# Patient Record
Sex: Male | Born: 1950 | Race: White | Hispanic: No | Marital: Married | Smoking: Current every day smoker
Health system: Southern US, Community
[De-identification: ages and names within clinical notes are randomized; demographics above are authoritative.]

## PROBLEM LIST (undated history)

## (undated) DIAGNOSIS — I739 Peripheral vascular disease, unspecified: Secondary | ICD-10-CM

## (undated) DIAGNOSIS — E785 Hyperlipidemia, unspecified: Secondary | ICD-10-CM

## (undated) DIAGNOSIS — D72829 Elevated white blood cell count, unspecified: Secondary | ICD-10-CM

## (undated) DIAGNOSIS — J449 Chronic obstructive pulmonary disease, unspecified: Secondary | ICD-10-CM

## (undated) DIAGNOSIS — Z8739 Personal history of other diseases of the musculoskeletal system and connective tissue: Secondary | ICD-10-CM

## (undated) DIAGNOSIS — I10 Essential (primary) hypertension: Secondary | ICD-10-CM

## (undated) DIAGNOSIS — E119 Type 2 diabetes mellitus without complications: Secondary | ICD-10-CM

## (undated) HISTORY — PX: ELBOW ARTHROCENTESIS: SUR46

## (undated) HISTORY — DX: Peripheral vascular disease, unspecified: I73.9

## (undated) HISTORY — PX: MOUTH SURGERY: SHX715

## (undated) HISTORY — PX: NO PAST SURGERIES: SHX2092

## (undated) HISTORY — DX: Personal history of other diseases of the musculoskeletal system and connective tissue: Z87.39

## (undated) HISTORY — DX: Elevated white blood cell count, unspecified: D72.829

---

## 2015-03-19 DIAGNOSIS — E782 Mixed hyperlipidemia: Secondary | ICD-10-CM | POA: Insufficient documentation

## 2017-06-27 ENCOUNTER — Encounter: Payer: Self-pay | Admitting: Emergency Medicine

## 2017-06-27 ENCOUNTER — Ambulatory Visit (INDEPENDENT_AMBULATORY_CARE_PROVIDER_SITE_OTHER): Payer: Medicare HMO

## 2017-06-27 ENCOUNTER — Ambulatory Visit
Admission: EM | Admit: 2017-06-27 | Discharge: 2017-06-27 | Disposition: A | Discharge status: Home / Self Care | Payer: Medicare HMO | Attending: Family Medicine | Admitting: Family Medicine

## 2017-06-27 DIAGNOSIS — B9789 Other viral agents as the cause of diseases classified elsewhere: Secondary | ICD-10-CM

## 2017-06-27 DIAGNOSIS — B349 Viral infection, unspecified: Secondary | ICD-10-CM

## 2017-06-27 DIAGNOSIS — J441 Chronic obstructive pulmonary disease with (acute) exacerbation: Secondary | ICD-10-CM | POA: Diagnosis not present

## 2017-06-27 DIAGNOSIS — J069 Acute upper respiratory infection, unspecified: Secondary | ICD-10-CM

## 2017-06-27 DIAGNOSIS — R05 Cough: Secondary | ICD-10-CM | POA: Diagnosis not present

## 2017-06-27 HISTORY — DX: Essential (primary) hypertension: I10

## 2017-06-27 HISTORY — DX: Hyperlipidemia, unspecified: E78.5

## 2017-06-27 MED ORDER — ALBUTEROL SULFATE HFA 108 (90 BASE) MCG/ACT IN AERS: 2.0000 | INHALATION_SPRAY | RESPIRATORY_TRACT | 0 refills | Status: DC | PRN

## 2017-06-27 MED ORDER — PREDNISONE 20 MG PO TABS: 40.0000 mg | ORAL_TABLET | Freq: Every day | ORAL | 0 refills | Status: DC

## 2017-06-27 MED ORDER — HYDROCOD POLST-CPM POLST ER 10-8 MG/5ML PO SUER: 5.0000 mL | Freq: Every evening | ORAL | 0 refills | Status: DC | PRN

## 2017-06-27 MED ORDER — BENZONATATE 100 MG PO CAPS: 100.0000 mg | ORAL_CAPSULE | Freq: Three times a day (TID) | ORAL | 0 refills | Status: DC | PRN

## 2017-06-27 MED ORDER — DOXYCYCLINE HYCLATE 100 MG PO CAPS: 100.0000 mg | ORAL_CAPSULE | Freq: Two times a day (BID) | ORAL | 0 refills | Status: DC

## 2017-06-27 NOTE — ED Provider Notes (Addendum)
[facility] ____________________________________________  Time seen: Approximately 1:15PM  I have reviewed the triage vital signs and the nursing notes.   HISTORY  Chief Complaint URI   HPI Angel Simon is a 66 y.o. male presenting with wife at bedside for 4-5 days of runny nose, nasal congestion, cough and chest congestion.  Reports tends to cough and what he describes as fits, coughing back to back frequently.  Denies accompanying chest pain or shortness of breath.  States has occasionally noticed some wheezing, and his wife reports intermittently hearing is wheezing, primarily in the morning.  Has had a taken over-the-counter medications for the same complaints.  Denies known fevers.  Denies sore throat.  Denies known history of COPD or respiratory problems.  Reports history of hypertension, hyperlipidemia and is a chronic smoker.  Denies aggravating or alleviating factors.  States cough is intermittently keeping him up at night.  Reports continues to eat and drink well.  Reports continues to remain active.  Reports otherwise feels well.  Denies chest pain, shortness of breath, abdominal pain, hemoptysis, dysuria, extremity pain, extremity swelling or rash. Denies recent sickness. Denies recent antibiotic use.   PCP: Establishing new primary care early December appointment.   Past Medical History:  Diagnosis Date  . Hyperlipidemia   . Hypertension     There are no active problems to display for this patient.   History reviewed. No pertinent surgical history.   No current facility-administered medications for this encounter.   Current Outpatient Prescriptions:  .  albuterol (PROVENTIL HFA;VENTOLIN HFA) 108 (90 Base) MCG/ACT inhaler, Inhale 2 puffs into the lungs every 4 (four) hours as needed for wheezing or shortness of breath., Disp: 1 Inhaler, Rfl: 0 .  benzonatate (TESSALON PERLES) 100 MG capsule, Take 1 capsule (100 mg total) by mouth 3 (three) times daily  as needed for cough., Disp: 15 capsule, Rfl: 0 .  chlorpheniramine-HYDROcodone (TUSSIONEX PENNKINETIC ER) 10-8 MG/5ML SUER, Take 5 mLs by mouth at bedtime as needed for cough. do not drive or operate machinery while taking as can cause drowsiness., Disp: 75 mL, Rfl: 0 .  doxycycline (VIBRAMYCIN) 100 MG capsule, Take 1 capsule (100 mg total) by mouth 2 (two) times daily., Disp: 20 capsule, Rfl: 0 .  predniSONE (DELTASONE) 20 MG tablet, Take 2 tablets (40 mg total) by mouth daily., Disp: 10 tablet, Rfl: 0  Allergies Aspirin   pertinent family history Wife: COPD and currently sick with some similar complaints.   Social History Social History  Substance Use Topics  . Smoking status: Current Every Day Smoker    Packs/day: 1.00    Years: 54.00    Types: Cigarettes  . Smokeless tobacco: Never Used  . Alcohol use No    Review of Systems Constitutional: No fever/chills ENT: No sore throat. Cardiovascular: Denies chest pain. Respiratory: Denies shortness of breath.  As above. Gastrointestinal: No abdominal pain.  No nausea, no vomiting.   Musculoskeletal: Negative for back pain. Skin: Negative for rash.  ____________________________________________   PHYSICAL EXAM:  VITAL SIGNS: ED Triage Vitals  Enc Vitals Group     BP 06/27/17 1201 (!) 146/80     Pulse Rate 06/27/17 1201 100     Resp 06/27/17 1201 20     Temp 06/27/17 1201 98.2 F (36.8 C)     Temp Source 06/27/17 1201 Oral     SpO2 06/27/17 1201 96 %     Weight 06/27/17 1201 220 lb (99.8 kg)     Height 06/27/17 1201  5\' 9"  (1.753 m)     Head Circumference --      Peak Flow --      Pain Score 06/27/17 1345 4     Pain Loc --      Pain Edu? --      Excl. in [place]? --     Constitutional: Alert and oriented. Well appearing and in no acute distress. Eyes: Conjunctivae are normal.  Head: Atraumatic. No sinus tenderness to palpation. No swelling. No erythema.  Ears: no erythema, normal TMs bilaterally.   Nose:Nasal congestion  with clear rhinorrhea  Mouth/Throat: Mucous membranes are moist. No pharyngeal erythema. No tonsillar swelling or exudate.  Neck: No stridor.  No cervical spine tenderness to palpation. Hematological/Lymphatic/Immunilogical: No cervical lymphadenopathy. Cardiovascular: Normal rate, regular rhythm. Grossly normal heart sounds.  Good peripheral circulation. Respiratory: Normal respiratory effort.  No retractions.  Very minimal scattered expiratory wheeze.  No focal rhonchi.  Good air movement.  Dry intermittent cough management with bronchospasm.  Speaks in complete sentences. Gastrointestinal: Soft and nontender.  Obese abdomen. Musculoskeletal: Ambulatory with steady gait. No cervical, thoracic or lumbar tenderness to palpation. Neurologic:  Normal speech and language. No gait instability. Skin:  Skin appears warm, dry and intact. No rash noted. Psychiatric: Mood and affect are normal. Speech and behavior are normal.  ___________________________________________   LABS (all labs ordered are listed, but only abnormal results are displayed)  Labs Reviewed - No data to display ____________________________________________  RADIOLOGY  Dg Chest 2 View  Result Date: 06/27/2017 CLINICAL DATA:  Cough and congestion for 3 days.  Current smoker. EXAM: CHEST  2 VIEW COMPARISON:  None. FINDINGS: Heart size and mediastinal contours are within normal limits. Atherosclerotic changes noted at the aortic arch. Lungs are hyperexpanded. Chronic bronchitic changes noted centrally. No evidence of pneumonia or pulmonary edema. No pleural effusion or pneumothorax seen. Mild degenerative spurring noted within the lower thoracic spine. No acute or suspicious osseous finding. IMPRESSION: 1. No active cardiopulmonary disease. No evidence of pneumonia or pulmonary edema. 2. Hyperexpanded lungs indicating COPD/emphysema. 3. Chronic bronchitic changes. 4. Aortic atherosclerosis. Electronically Signed   By: Franki Cabot  M.D.   On: 06/27/2017 13:24   ____________________________________________  PROCEDURES Procedures   INITIAL IMPRESSION / ASSESSMENT AND PLAN / ED COURSE  Pertinent labs & imaging results that were available during my care of the patient were reviewed by me and considered in my medical decision making (see chart for details).  Well-appearing patient, laughing in room. No acute distress.  Vital signs reviewed, suspect patient with history of COPD, undiagnosed.  Chest x-ray performed, no active cardiopulmonary disease, no pneumonia, however hyperexpanded lungs indicating COPD or emphysema per radiologist.  As well as chronic bronchitic changes.  Discussed in detail with patient and wife suspect COPD and suspect viral upper respiratory infection.  Patient reports wife with similar complaints.,  Will treat patient with 5-day course of prednisone, as needed albuterol inhaler, as needed Tessalon Perles and as needed Tussionex.  Discussed in detail with patient no clear need of antibiotic at this time, however if symptoms persist for 2-3 more days then to start oral doxycycline.  Discussed prompt reevaluation for any worsening concerns.  Encourage sooner follow-up and establishment with primary care. Discussed indication, risks and benefits of medications with patient.  Discussed follow up and return parameters including no resolution or any worsening concerns. Patient verbalized understanding and agreed to plan.   ____________________________________________   FINAL CLINICAL IMPRESSION(S) / ED DIAGNOSES  Final diagnoses:  COPD exacerbation ([place])  Viral URI with cough     Discharge Medication List as of 06/27/2017  1:43 PM    START taking these medications   Details  albuterol (PROVENTIL HFA;VENTOLIN HFA) 108 (90 Base) MCG/ACT inhaler Inhale 2 puffs into the lungs every 4 (four) hours as needed for wheezing or shortness of breath., Starting Sun 06/27/2017, Normal    benzonatate (TESSALON  PERLES) 100 MG capsule Take 1 capsule (100 mg total) by mouth 3 (three) times daily as needed for cough., Starting Sun 06/27/2017, Normal    chlorpheniramine-HYDROcodone (TUSSIONEX PENNKINETIC ER) 10-8 MG/5ML SUER Take 5 mLs by mouth at bedtime as needed for cough. do not drive or operate machinery while taking as can cause drowsiness., Starting Sun 06/27/2017, Print    doxycycline (VIBRAMYCIN) 100 MG capsule Take 1 capsule (100 mg total) by mouth 2 (two) times daily., Starting Sun 06/27/2017, Print    predniSONE (DELTASONE) 20 MG tablet Take 2 tablets (40 mg total) by mouth daily., Starting Sun 06/27/2017, Normal        Note: This dictation was prepared with Dragon dictation along with smaller phrase technology. Any transcriptional errors that result from this process are unintentional.           Marylene Land, NP 06/27/17 1550

## 2017-06-27 NOTE — Discharge Instructions (Signed)
Take medication as prescribed. Rest. Drink plenty of fluids.  ° °Follow up with your primary care physician this week as needed. Return to Urgent care for new or worsening concerns.  ° °

## 2017-06-27 NOTE — ED Triage Notes (Signed)
Cough that has moved to his chest, nasal drainage, sneezing, congested  For 5 days

## 2017-08-03 DIAGNOSIS — J449 Chronic obstructive pulmonary disease, unspecified: Secondary | ICD-10-CM | POA: Insufficient documentation

## 2017-08-10 DIAGNOSIS — Z720 Tobacco use: Secondary | ICD-10-CM | POA: Insufficient documentation

## 2017-08-10 DIAGNOSIS — I10 Essential (primary) hypertension: Secondary | ICD-10-CM | POA: Insufficient documentation

## 2017-08-10 DIAGNOSIS — Z8639 Personal history of other endocrine, nutritional and metabolic disease: Secondary | ICD-10-CM | POA: Insufficient documentation

## 2017-09-07 DIAGNOSIS — E119 Type 2 diabetes mellitus without complications: Secondary | ICD-10-CM | POA: Insufficient documentation

## 2018-01-07 ENCOUNTER — Ambulatory Visit
Admission: EM | Admit: 2018-01-07 | Discharge: 2018-01-07 | Disposition: A | Discharge status: Home / Self Care | Payer: Medicare HMO | Attending: Family Medicine | Admitting: Family Medicine

## 2018-01-07 ENCOUNTER — Other Ambulatory Visit: Payer: Self-pay

## 2018-01-07 DIAGNOSIS — M25511 Pain in right shoulder: Secondary | ICD-10-CM

## 2018-01-07 HISTORY — DX: Type 2 diabetes mellitus without complications: E11.9

## 2018-01-07 MED ORDER — MELOXICAM 15 MG PO TABS: 15.0000 mg | ORAL_TABLET | Freq: Every day | ORAL | 0 refills | Status: DC | PRN

## 2018-01-07 MED ORDER — CYCLOBENZAPRINE HCL 10 MG PO TABS: 10.0000 mg | ORAL_TABLET | Freq: Three times a day (TID) | ORAL | 0 refills | Status: DC | PRN

## 2018-01-07 NOTE — ED Triage Notes (Signed)
Pain to right shoulder and into right neck starting yesterday. Difficulty lifting arm. Pain 8/10. No known injury.

## 2018-01-07 NOTE — ED Provider Notes (Signed)
[facility]  CSN: [id number] Arrival date & time: 01/07/18  1248  History   Chief Complaint Chief Complaint  Patient presents with  . Shoulder Pain   HPI   67 year old male presents with right shoulder pain.  Started yesterday.  Patient's pain is located in the anterior shoulder in the trapezius region.  He reports difficulty lifting his right arm.  No recent change in activities.  No fall, trauma, injury.  Pain is moderate to severe.  Worse with range of motion.  No relieving factors.  No other associated symptoms.  No other complaints or concerns at this time.  Past Medical History:  Diagnosis Date  . Diabetes mellitus without complication ([place])   . Hyperlipidemia   . Hypertension    History reviewed. No pertinent surgical history.  Home Medications    Prior to Admission medications   Medication Sig Start Date End Date Taking? Authorizing Provider  Fluticasone Propionate, Inhal, (FLOVENT DISKUS) 250 MCG/BLIST AEPB Inhale into the lungs. 08/03/17 08/03/18 Yes [provider]  metFORMIN (GLUCOPHAGE-XR) 500 MG 24 hr tablet Take by mouth. 09/07/17 09/07/18 Yes [provider]  metoprolol tartrate (LOPRESSOR) 25 MG tablet Take by mouth. 08/03/17  Yes [provider]  simvastatin (ZOCOR) 20 MG tablet Take by mouth. 08/03/17  Yes [provider]  albuterol (PROVENTIL HFA;VENTOLIN HFA) 108 (90 Base) MCG/ACT inhaler Inhale 2 puffs into the lungs every 4 (four) hours as needed for wheezing or shortness of breath. 06/27/17   Marylene Land, NP  cyclobenzaprine (FLEXERIL) 10 MG tablet Take 1 tablet (10 mg total) by mouth 3 (three) times daily as needed. 01/07/18   Coral Spikes, DO  lisinopril (PRINIVIL,ZESTRIL) 40 MG tablet  11/09/17   [provider]  meloxicam (MOBIC) 15 MG tablet Take 1 tablet (15 mg total) by mouth daily as needed. 01/07/18   Coral Spikes, DO   Family History History reviewed. No pertinent family history.  Social  History Social History   Tobacco Use  . Smoking status: Current Every Day Smoker    Packs/day: 1.00    Years: 54.00    Pack years: 54.00    Types: Cigarettes  . Smokeless tobacco: Never Used  Substance Use Topics  . Alcohol use: No  . Drug use: No   Allergies   Aspirin  Review of Systems Review of Systems  Constitutional: Negative.   Musculoskeletal:       Right shoulder pain.   Physical Exam Triage Vital Signs ED Triage Vitals  Enc Vitals Group     BP 01/07/18 1307 (!) 154/82     Pulse Rate 01/07/18 1307 77     Resp 01/07/18 1307 20     Temp 01/07/18 1307 97.9 F (36.6 C)     Temp Source 01/07/18 1307 Oral     SpO2 01/07/18 1307 97 %     Weight 01/07/18 1307 225 lb (102.1 kg)     Height 01/07/18 1307 5\' 9"  (1.753 m)     Head Circumference --      Peak Flow --      Pain Score 01/07/18 1306 8     Pain Loc --      Pain Edu? --      Excl. in [place]? --    Updated Vital Signs BP (!) 154/82 (BP Location: Right Arm)   Pulse 77   Temp 97.9 F (36.6 C) (Oral)   Resp 20   Ht 5\' 9"  (1.753 m)   Wt 225  lb (102.1 kg)   SpO2 97%   BMI 33.23 kg/m   Physical Exam  Constitutional: He is oriented to person, place, and time. He appears well-developed. No distress.  Cardiovascular: Normal rate and regular rhythm.  Pulmonary/Chest: Effort normal and breath sounds normal.  Musculoskeletal:  Right shoulder: Patient with tenderness in the right trapezius. Unable to assess rotator cuff due to pain with range of motion.  Neurological: He is alert and oriented to person, place, and time.  Psychiatric: He has a normal mood and affect. His behavior is normal.  Nursing note and vitals reviewed.  UC Treatments / Results  Labs (all labs ordered are listed, but only abnormal results are displayed) Labs Reviewed - No data to display  EKG None  Radiology No results found.  Procedures Procedures (including critical care time)  Medications Ordered in UC Medications - No data  to display  Initial Impression / Assessment and Plan / UC Course  I have reviewed the triage vital signs and the nursing notes.  Pertinent labs & imaging results that were available during my care of the patient were reviewed by me and considered in my medical decision making (see chart for details).    67 year old male presents with right shoulder pain.  There is a component of muscle spasm.  May have rotator cuff involvement, however difficult to ascertain as his exam is limited.  Treated with meloxicam and Flexeril.  Final Clinical Impressions(s) / UC Diagnoses   Final diagnoses:  Acute pain of right shoulder     Discharge Instructions     Rest.   Ice as needed.  Medications as directed.  Take care  Dr. Lacinda Axon    ED Prescriptions    Medication Sig Dispense Auth. Provider   meloxicam (MOBIC) 15 MG tablet Take 1 tablet (15 mg total) by mouth daily as needed. 30 tablet Nalda Shackleford G, DO   cyclobenzaprine (FLEXERIL) 10 MG tablet Take 1 tablet (10 mg total) by mouth 3 (three) times daily as needed. 30 tablet Coral Spikes, DO     Controlled Substance Prescriptions [place] Controlled Substance Registry consulted? Not Applicable   Coral Spikes, DO 01/07/18 1426

## 2018-01-07 NOTE — Discharge Instructions (Signed)
Rest.   Ice as needed.  Medications as directed.  Take care  Dr. Lacinda Axon

## 2018-05-20 ENCOUNTER — Other Ambulatory Visit: Payer: Self-pay | Admitting: Family Medicine

## 2018-05-20 DIAGNOSIS — Z136 Encounter for screening for cardiovascular disorders: Secondary | ICD-10-CM

## 2018-05-24 ENCOUNTER — Other Ambulatory Visit: Payer: Self-pay | Admitting: Family Medicine

## 2018-05-24 DIAGNOSIS — Z136 Encounter for screening for cardiovascular disorders: Secondary | ICD-10-CM

## 2018-05-27 ENCOUNTER — Telehealth: Payer: Self-pay | Admitting: *Deleted

## 2018-05-27 DIAGNOSIS — Z122 Encounter for screening for malignant neoplasm of respiratory organs: Secondary | ICD-10-CM

## 2018-05-27 DIAGNOSIS — Z87891 Personal history of nicotine dependence: Secondary | ICD-10-CM

## 2018-05-27 NOTE — Telephone Encounter (Signed)
Received referral for initial lung cancer screening scan. Contacted patient and obtained smoking history,(current, 54 pack year) as well as answering questions related to screening process. Patient denies signs of lung cancer such as weight loss or hemoptysis. Patient denies comorbidity that would prevent curative treatment if lung cancer were found. Patient is scheduled for shared decision making visit and CT scan on 06/06/18 at 1pm.

## 2018-05-31 ENCOUNTER — Ambulatory Visit: Admission: RE | Admit: 2018-05-31 | Payer: Medicare HMO | Source: Ambulatory Visit

## 2018-06-03 ENCOUNTER — Telehealth: Payer: Self-pay | Admitting: *Deleted

## 2018-06-03 NOTE — Telephone Encounter (Signed)
Called to remind patient of his appt for Monday 06-06-18 at 1300 for ldct screening, message left for patient

## 2018-06-06 ENCOUNTER — Ambulatory Visit
Admission: RE | Admit: 2018-06-06 | Discharge: 2018-06-06 | Disposition: A | Discharge status: Home / Self Care | Payer: Medicare HMO | Source: Ambulatory Visit | Attending: Family Medicine | Admitting: Family Medicine

## 2018-06-06 ENCOUNTER — Inpatient Hospital Stay: Payer: Medicare HMO | Attending: Oncology | Admitting: Oncology

## 2018-06-06 ENCOUNTER — Ambulatory Visit: Admission: RE | Admit: 2018-06-06 | Payer: Medicare HMO | Source: Ambulatory Visit

## 2018-06-06 ENCOUNTER — Ambulatory Visit
Admission: RE | Admit: 2018-06-06 | Discharge: 2018-06-06 | Disposition: A | Discharge status: Home / Self Care | Payer: Medicare HMO | Source: Ambulatory Visit | Attending: Oncology | Admitting: Oncology

## 2018-06-06 DIAGNOSIS — Z122 Encounter for screening for malignant neoplasm of respiratory organs: Secondary | ICD-10-CM | POA: Diagnosis not present

## 2018-06-06 DIAGNOSIS — D3502 Benign neoplasm of left adrenal gland: Secondary | ICD-10-CM | POA: Insufficient documentation

## 2018-06-06 DIAGNOSIS — Z87891 Personal history of nicotine dependence: Secondary | ICD-10-CM | POA: Insufficient documentation

## 2018-06-06 DIAGNOSIS — Z136 Encounter for screening for cardiovascular disorders: Secondary | ICD-10-CM | POA: Diagnosis present

## 2018-06-06 DIAGNOSIS — R911 Solitary pulmonary nodule: Secondary | ICD-10-CM | POA: Insufficient documentation

## 2018-06-06 NOTE — Progress Notes (Signed)
In accordance with CMS guidelines, patient has met eligibility criteria including age, absence of signs or symptoms of lung cancer.  Social History   Tobacco Use  . Smoking status: Current Every Day Smoker    Packs/day: 1.00    Years: 54.00    Pack years: 54.00    Types: Cigarettes  . Smokeless tobacco: Never Used  Substance Use Topics  . Alcohol use: No  . Drug use: No     A shared decision-making session was conducted prior to the performance of CT scan. This includes one or more decision aids, includes benefits and harms of screening, follow-up diagnostic testing, over-diagnosis, false positive rate, and total radiation exposure.  Counseling on the importance of adherence to annual lung cancer LDCT screening, impact of co-morbidities, and ability or willingness to undergo diagnosis and treatment is imperative for compliance of the program.  Counseling on the importance of continued smoking cessation for former smokers; the importance of smoking cessation for current smokers, and information about tobacco cessation interventions have been given to patient including [place] and [employer] quit [place] programs.  Written order for lung cancer screening with LDCT has been given to the patient and any and all questions have been answered to the best of my abilities.   Yearly follow up will be coordinated by Burgess Estelle, Thoracic Navigator.  Faythe Casa, NP 06/06/2018 1:36 PM

## 2018-06-09 ENCOUNTER — Ambulatory Visit (INDEPENDENT_AMBULATORY_CARE_PROVIDER_SITE_OTHER): Payer: Medicare HMO | Admitting: Nurse Practitioner

## 2018-06-09 ENCOUNTER — Encounter (INDEPENDENT_AMBULATORY_CARE_PROVIDER_SITE_OTHER): Payer: Self-pay | Admitting: Nurse Practitioner

## 2018-06-09 VITALS — BP 178/84 | HR 71 | Resp 16 | Ht 69.0 in | Wt 223.4 lb

## 2018-06-09 DIAGNOSIS — E119 Type 2 diabetes mellitus without complications: Secondary | ICD-10-CM

## 2018-06-09 DIAGNOSIS — F17210 Nicotine dependence, cigarettes, uncomplicated: Secondary | ICD-10-CM

## 2018-06-09 DIAGNOSIS — E782 Mixed hyperlipidemia: Secondary | ICD-10-CM

## 2018-06-09 DIAGNOSIS — I10 Essential (primary) hypertension: Secondary | ICD-10-CM | POA: Diagnosis not present

## 2018-06-09 DIAGNOSIS — Z7984 Long term (current) use of oral hypoglycemic drugs: Secondary | ICD-10-CM

## 2018-06-09 DIAGNOSIS — I739 Peripheral vascular disease, unspecified: Secondary | ICD-10-CM | POA: Diagnosis not present

## 2018-06-10 ENCOUNTER — Encounter (INDEPENDENT_AMBULATORY_CARE_PROVIDER_SITE_OTHER): Payer: Self-pay | Admitting: Nurse Practitioner

## 2018-06-10 DIAGNOSIS — I739 Peripheral vascular disease, unspecified: Secondary | ICD-10-CM | POA: Insufficient documentation

## 2018-06-10 NOTE — Progress Notes (Signed)
Subjective:    Patient ID: Angel Simon, male    DOB: 1951/03/04, 67 y.o.   MRN: [id number] Chief Complaint  Patient presents with  . New Patient (Initial Visit)    ref [facility] for claudication    HPI  Angel Simon is a 67 y.o. male that is referred to [place] from [facility], [place].The patient is seen for evaluation of painful lower extremities and diminished pulses.  Angel Simon has had pain in his bilateral lower extremities for several years however now it is coming to a lifestyle limiting point. Patient notes the pain is always associated with activity and is very consistent day today. Typically, the pain occurs at less than one block, progress is as activity continues to the point that the patient must stop walking. Resting including standing still for several minutes allowed resumption of the activity and the ability to walk a similar distance before stopping again. Uneven terrain and inclined shorten the distance. The pain has been progressive over the past several years. The patient states the inability to walk is now having a profound negative impact on quality of life and daily activities.  The patient endorses some rest pain which is also prompted his visit.   No open wounds or sores at this time. No prior interventions or surgeries.  There is a current history of back problems due to an accident in a tractor trailer.  The patient's blood pressure has been stable and relatively well controlled. The patient denies amaurosis fugax or recent TIA symptoms. There are no recent neurological changes noted. The patient denies history of DVT, PE or superficial thrombophlebitis. The patient denies recent episodes of angina or shortness of breath.   Review of Systems   Review of Systems: Negative Unless Checked Constitutional: [] Weight loss  [] Fever  [] Chills Cardiac: [] Chest pain   ? Atrial Fibrillation  [] Palpitations   [] Shortness of breath when laying flat   [] Shortness of breath with  exertion. Vascular:  [x] Pain in legs with walking   [x] Pain in legs with standing  [] History of DVT   [] Phlebitis   [] Swelling in legs   [] Varicose veins   [] Non-healing ulcers Pulmonary:   [] Uses home oxygen   [] Productive cough   [] Hemoptysis   [] Wheeze  [x] COPD   [] Asthma Neurologic:  [] Dizziness   [] Seizures   [] History of stroke   [] History of TIA  [] Aphasia   [] Vissual changes   [] Weakness or numbness in arm   [] Weakness or numbness in leg Musculoskeletal:   [] Joint swelling   [] Joint pain   [] Low back pain  ? History of Knee Replacement Hematologic:  [] Easy bruising  [] Easy bleeding   [] Hypercoagulable state   [] Anemic Gastrointestinal:  [] Diarrhea   [] Vomiting  [] Gastroesophageal reflux/heartburn   [] Difficulty swallowing. Genitourinary:  [] Chronic kidney disease   [] Difficult urination  [] Anuric   [] Blood in urine Skin:  [] Rashes   [] Ulcers  Psychological:  [] History of anxiety   []  History of major depression  ? Memory Difficulties     Objective:   Physical Exam  BP (!) 178/84 (BP Location: Right Arm)   Pulse 71   Resp 16   Ht 5\' 9"  (1.753 m)   Wt 223 lb 6.4 oz (101.3 kg)   BMI 32.99 kg/m   Past Medical History:  Diagnosis Date  . Diabetes mellitus without complication ([place])   . Hyperlipidemia   . Hypertension      Gen: WD/WN, NAD Head: [place]/AT, No temporalis wasting.  Poor dentition Ear/Nose/Throat: Hearing grossly  intact, nares w/o erythema or drainage Eyes: PER, EOMI, sclera nonicteric.  Neck: Supple, no masses.  No JVD.  Pulmonary:  Good air movement, no use of accessory muscles.  Cardiac: RRR Vascular:  Bilateral feet warm.  No ulcers present. Vessel Right Left  Dorsalis Pedis  non-palpable  non-palpable  Posterior Tibial  non-palpable  none palpable   Gastrointestinal: soft, non-distended. No guarding/no peritoneal signs.  Musculoskeletal: M/S 5/5 throughout.  No deformity or atrophy.  Neurologic: Pain and light touch intact in extremities.  Symmetrical.   Speech is fluent. Motor exam as listed above. Psychiatric: Judgment intact, Mood & affect appropriate for pt's clinical situation. Dermatologic: No Venous rashes. No Ulcers Noted.  No changes consistent with cellulitis.  Dark thick yellow toenails Lymph : No Cervical lymphadenopathy, no lichenification or skin changes of chronic lymphedema.   Social History   Socioeconomic History  . Marital status: Married    Spouse name: Not on file  . Number of children: Not on file  . Years of education: Not on file  . Highest education level: Not on file  Occupational History  . Not on file  Social Needs  . Financial resource strain: Not on file  . Food insecurity:    Worry: Not on file    Inability: Not on file  . Transportation needs:    Medical: Not on file    Non-medical: Not on file  Tobacco Use  . Smoking status: Current Every Day Smoker    Packs/day: 1.00    Years: 54.00    Pack years: 54.00    Types: Cigarettes  . Smokeless tobacco: Never Used  Substance and Sexual Activity  . Alcohol use: No  . Drug use: No  . Sexual activity: Yes  Lifestyle  . Physical activity:    Days per week: Not on file    Minutes per session: Not on file  . Stress: Not on file  Relationships  . Social connections:    Talks on phone: Not on file    Gets together: Not on file    Attends religious service: Not on file    Active member of club or organization: Not on file    Attends meetings of clubs or organizations: Not on file    Relationship status: Not on file  . Intimate partner violence:    Fear of current or ex partner: Not on file    Emotionally abused: Not on file    Physically abused: Not on file    Forced sexual activity: Not on file  Other Topics Concern  . Not on file  Social History Narrative  . Not on file    Past Surgical History:  Procedure Laterality Date  . NO PAST SURGERIES      Family History  Problem Relation Age of Onset  . Diabetes Mother   . Cancer Father     . Cancer Sister     Allergies  Allergen Reactions  . Aspirin        Assessment & Plan:   1. PAD (peripheral artery disease) (HCC) Recommend:  The patient has evidence of atherosclerosis of the lower extremities with claudication.  The patient does voice lifestyle limiting changes at this point in time.  Patient should undergo arterial duplex of the lower extremity ASAP because there has been a significant deterioration in the patient's lower extremity symptoms.  The patient states they are having increased pain and a marked decrease in the distance that they can walk.  The  risks and benefits as well as the alternatives were discussed in detail with the patient.  All questions were answered.  Patient agrees to proceed and understands this could be a prelude to angiography and intervention.  The patient will follow up with me in the office to review the studies.   - VAS [place] ABI WITH/WO TBI; Future - VAS [place] LOWER EXTREMITY ARTERIAL DUPLEX; Future  2. Essential hypertension Continue antihypertensive medications as already ordered, these medications have been reviewed and there are no changes at this time.   3. Type 2 diabetes mellitus without complication, without long-term current use of insulin (HCC) Continue hypoglycemic medications as already ordered, these medications have been reviewed and there are no changes at this time.  Hgb A1C to be monitored as already arranged by primary service   4. Hyperlipidemia, mixed Continue statin as ordered and reviewed, no changes at this time    Current Outpatient Medications on File Prior to Visit  Medication Sig Dispense Refill  . cyclobenzaprine (FLEXERIL) 10 MG tablet Take 1 tablet (10 mg total) by mouth 3 (three) times daily as needed. 30 tablet 0  . lisinopril (PRINIVIL,ZESTRIL) 40 MG tablet     . meloxicam (MOBIC) 15 MG tablet Take 1 tablet (15 mg total) by mouth daily as needed. 30 tablet 0  . metFORMIN (GLUCOPHAGE-XR) 500 MG  24 hr tablet Take by mouth.    . metoprolol tartrate (LOPRESSOR) 25 MG tablet Take by mouth.    . simvastatin (ZOCOR) 20 MG tablet Take by mouth.    Marland Kitchen albuterol (PROVENTIL HFA;VENTOLIN HFA) 108 (90 Base) MCG/ACT inhaler Inhale 2 puffs into the lungs every 4 (four) hours as needed for wheezing or shortness of breath. (Patient not taking: Reported on 06/09/2018) 1 Inhaler 0  . Fluticasone Propionate, Inhal, (FLOVENT DISKUS) 250 MCG/BLIST AEPB Inhale into the lungs.     No current facility-administered medications on file prior to visit.     There are no Patient Instructions on file for this visit. No follow-ups on file.   Kris Hartmann, NP

## 2018-06-13 ENCOUNTER — Telehealth: Payer: Self-pay | Admitting: *Deleted

## 2018-06-13 ENCOUNTER — Encounter: Payer: Self-pay | Admitting: *Deleted

## 2018-06-13 NOTE — Telephone Encounter (Signed)
After further discussion with radiologist, and multiple attempts to contact patient, letter sent to notify patient that he will need to have 3 month follow up imaging. Results will be faxed to referring provider.

## 2018-07-12 ENCOUNTER — Encounter (INDEPENDENT_AMBULATORY_CARE_PROVIDER_SITE_OTHER): Payer: Medicare HMO

## 2018-07-12 ENCOUNTER — Ambulatory Visit (INDEPENDENT_AMBULATORY_CARE_PROVIDER_SITE_OTHER): Payer: Medicare HMO

## 2018-07-12 ENCOUNTER — Encounter (INDEPENDENT_AMBULATORY_CARE_PROVIDER_SITE_OTHER): Payer: Self-pay | Admitting: Nurse Practitioner

## 2018-07-12 ENCOUNTER — Ambulatory Visit (INDEPENDENT_AMBULATORY_CARE_PROVIDER_SITE_OTHER): Payer: Medicare HMO | Admitting: Nurse Practitioner

## 2018-07-12 VITALS — BP 143/84 | HR 72 | Resp 17 | Ht 69.0 in | Wt 225.0 lb

## 2018-07-12 DIAGNOSIS — E119 Type 2 diabetes mellitus without complications: Secondary | ICD-10-CM

## 2018-07-12 DIAGNOSIS — I739 Peripheral vascular disease, unspecified: Secondary | ICD-10-CM

## 2018-07-12 DIAGNOSIS — E782 Mixed hyperlipidemia: Secondary | ICD-10-CM

## 2018-07-12 DIAGNOSIS — I10 Essential (primary) hypertension: Secondary | ICD-10-CM

## 2018-07-12 DIAGNOSIS — R9439 Abnormal result of other cardiovascular function study: Secondary | ICD-10-CM

## 2018-07-12 DIAGNOSIS — F17210 Nicotine dependence, cigarettes, uncomplicated: Secondary | ICD-10-CM

## 2018-07-12 NOTE — Progress Notes (Signed)
Subjective:    Patient ID: Angel Simon, male    DOB: [birth year], 67 y.o.   MRN: [id number] Chief Complaint  Patient presents with  . Follow-up    ABI and Bilateral Arterial Duplex    HPI  Angel Simon is a 67 y.o. male that returns to the office for followup and review of the noninvasive studies.  Patient presents with his wife today. The patient notes interval shortening of their claudication distance.  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  No new ulcers or wounds have occurred since the last visit.  There have been no significant changes to the patient's overall health care.  The patient denies amaurosis fugax or recent TIA symptoms. There are no recent neurological changes noted. The patient denies history of DVT, PE or superficial thrombophlebitis. The patient denies recent episodes of angina or shortness of breath.   ABI's Rt=0.78 and Lt=0.83 there are no previous ABIs for comparison. Duplex US of the lower extremity arterial system shows triphasic waveforms throughout the left lower extremity.  Although it is noted that there is a 30 to 49% stenosis in the left popliteal artery.  The right lower extremity has triphasic waveforms noted down to the level of the distal superficial femoral artery.  There are monophasic waveforms passes point.  It is noted that there is a 50 to 74% stenosis noted in the superficial femoral artery or the popliteal artery.  Past Medical History:  Diagnosis Date  . Diabetes mellitus without complication ([place])   . Hyperlipidemia   . Hypertension     Past Surgical History:  Procedure Laterality Date  . NO PAST SURGERIES      Social History   Socioeconomic History  . Marital status: Married    Spouse name: Not on file  . Number of children: Not on file  . Years of education: Not on file  . Highest education level: Not on file  Occupational History  . Not on file  Social Needs   . Financial resource strain: Not on file  . Food insecurity:    Worry: Not on file    Inability: Not on file  . Transportation needs:    Medical: Not on file    Non-medical: Not on file  Tobacco Use  . Smoking status: Current Every Day Smoker    Packs/day: 1.00    Years: 54.00    Pack years: 54.00    Types: Cigarettes  . Smokeless tobacco: Never Used  Substance and Sexual Activity  . Alcohol use: No  . Drug use: No  . Sexual activity: Yes  Lifestyle  . Physical activity:    Days per week: Not on file    Minutes per session: Not on file  . Stress: Not on file  Relationships  . Social connections:    Talks on phone: Not on file    Gets together: Not on file    Attends religious service: Not on file    Active member of club or organization: Not on file    Attends meetings of clubs or organizations: Not on file    Relationship status: Not on file  . Intimate partner violence:    Fear of current or ex partner: Not on file    Emotionally abused: Not on file    Physically abused: Not on file    Forced sexual activity: Not on file  Other Topics Concern  . Not on file  Social History Narrative  . Not on file    Family History  Problem Relation Age of Onset  . Diabetes Mother   . Cancer Father   . Cancer Sister     Allergies  Allergen Reactions  . Aspirin      Review of Systems   Review of Systems: Negative Unless Checked Constitutional: [] Weight loss  [] Fever  [] Chills Cardiac: [] Chest pain   []  Atrial Fibrillation  [] Palpitations   [] Shortness of breath when laying flat   [] Shortness of breath with exertion. Vascular:  [x] Pain in legs with walking   [] Pain in legs with standing  [] History of DVT   [] Phlebitis   [] Swelling in legs   [] Varicose veins   [] Non-healing ulcers Pulmonary:   [] Uses home oxygen   [] Productive cough   [] Hemoptysis   [] Wheeze  [x] COPD   [] Asthma Neurologic:  [] Dizziness   [] Seizures   [] History of stroke   [] History of TIA  [] Aphasia    [] Vissual changes   [] Weakness or numbness in arm   [] Weakness or numbness in leg Musculoskeletal:   [] Joint swelling   [] Joint pain   [] Low back pain  []  History of Knee Replacement Hematologic:  [] Easy bruising  [] Easy bleeding   [] Hypercoagulable state   [] Anemic Gastrointestinal:  [] Diarrhea   [] Vomiting  [x] Gastroesophageal reflux/heartburn   [] Difficulty swallowing. Genitourinary:  [] Chronic kidney disease   [] Difficult urination  [] Anuric   [] Blood in urine Skin:  [] Rashes   [] Ulcers  Psychological:  [] History of anxiety   []  History of major depression  []  Memory Difficulties     Objective:   Physical Exam  BP (!) 143/84 (BP Location: Left Arm, Patient Position: Sitting)   Pulse 72   Resp 17   Ht 5\' 9"  (1.753 m)   Wt 225 lb (102.1 kg)   BMI 33.23 kg/m   Gen: WD/WN, NAD Head: [place]/AT, No temporalis wasting, poor dentition Ear/Nose/Throat: Hearing grossly intact, nares w/o erythema or drainage Eyes: PER, EOMI, sclera nonicteric.  Neck: Supple, no masses.  No JVD.  Pulmonary:  Good air movement, no use of accessory muscles.  Cardiac: RRR Vascular:  Vessel Right Left  Radial Palpable Palpable  Dorsalis Pedis  non-palpable  none palpable  Posterior Tibial non palpable  none palpable   Gastrointestinal: soft, non-distended. No guarding/no peritoneal signs.  Musculoskeletal: M/S 5/5 throughout.  No deformity or atrophy.  Neurologic: Pain and light touch intact in extremities.  Symmetrical.  Speech is fluent. Motor exam as listed above. Psychiatric: Judgment intact, Mood & affect appropriate for pt's clinical situation. Dermatologic: No Venous rashes. No Ulcers Noted.  No changes consistent with cellulitis. Lymph : No Cervical lymphadenopathy, no lichenification or skin changes of chronic lymphedema.      Assessment & Plan:   1. PAD (peripheral artery disease) (HCC) ABI's Rt=0.78 and Lt=0.83 there are no previous ABIs for comparison. Duplex US of the lower extremity  arterial system shows triphasic waveforms throughout the left lower extremity.  Although it is noted that there is a 30 to 49% stenosis in the left popliteal artery.  The right lower extremity has triphasic waveforms noted down to the level of the distal superficial femoral artery.  There are monophasic waveforms passes point.  It is noted that there is a 50 to 74% stenosis noted in the superficial femoral artery or the popliteal artery.  Recommend:  The patient has experienced increased symptoms and is now describing lifestyle limiting claudication and mild rest pain.   Given the  severity of the patient's lower extremity symptoms the patient should undergo angiography and intervention.  Risk and benefits were reviewed the patient.  Indications for the procedure were reviewed.  All questions were answered, the patient agrees to proceed.   The patient should continue walking and begin a more formal exercise program.  The patient should continue antiplatelet therapy and aggressive treatment of the lipid abnormalities  The patient requests that due to his work schedule we delay angiogram until after the beginning of the year.  Patient was advised that if any wound or ulcer formation should occur or change of intensity/consistency he should contact us to schedule the procedure sooner.  I have also advised the patient that if following the angiogram the pain that he is describing in his feet is not resolved, it may be a component of neuropathy which we may have to explore.  The patient will follow up with me after the angiogram.   2. Type 2 diabetes mellitus without complication, without long-term current use of insulin (HCC) Continue hypoglycemic medications as already ordered, these medications have been reviewed and there are no changes at this time.  Hgb A1C to be monitored as already arranged by primary service   3. Hyperlipidemia, mixed Continue statin as ordered and reviewed, no changes at  this time   4. Essential hypertension Continue antihypertensive medications as already ordered, these medications have been reviewed and there are no changes at this time.    Current Outpatient Medications on File Prior to Visit  Medication Sig Dispense Refill  . cyclobenzaprine (FLEXERIL) 10 MG tablet Take 1 tablet (10 mg total) by mouth 3 (three) times daily as needed. 30 tablet 0  . Fluticasone Propionate, Inhal, (FLOVENT DISKUS) 250 MCG/BLIST AEPB Inhale into the lungs.    . Fluticasone-Salmeterol (ADVAIR) 250-50 MCG/DOSE AEPB Inhale into the lungs.    Marland Kitchen lisinopril (PRINIVIL,ZESTRIL) 40 MG tablet     . meloxicam (MOBIC) 15 MG tablet Take 1 tablet (15 mg total) by mouth daily as needed. 30 tablet 0  . metFORMIN (GLUCOPHAGE-XR) 500 MG 24 hr tablet Take by mouth.    . metoprolol tartrate (LOPRESSOR) 25 MG tablet Take by mouth.    . simvastatin (ZOCOR) 20 MG tablet Take by mouth.    Marland Kitchen albuterol (PROVENTIL HFA;VENTOLIN HFA) 108 (90 Base) MCG/ACT inhaler Inhale 2 puffs into the lungs every 4 (four) hours as needed for wheezing or shortness of breath. (Patient not taking: Reported on 06/09/2018) 1 Inhaler 0  . HYDROcodone-acetaminophen (NORCO/VICODIN) 5-325 MG tablet Take one tablet at night for pain; may take up to every 6 hours as needed for pain if not working or driving     No current facility-administered medications on file prior to visit.     There are no Patient Instructions on file for this visit. No follow-ups on file.   Kris Hartmann, NP  This note was completed with Sales executive.  Any errors are purely unintentional.

## 2018-07-21 ENCOUNTER — Encounter (INDEPENDENT_AMBULATORY_CARE_PROVIDER_SITE_OTHER): Payer: Self-pay

## 2018-08-01 ENCOUNTER — Telehealth (INDEPENDENT_AMBULATORY_CARE_PROVIDER_SITE_OTHER): Payer: Self-pay | Admitting: Nurse Practitioner

## 2018-08-01 NOTE — Telephone Encounter (Signed)
Spoke with wife when calling to clarify information for FMLA paperwork.  The patient stated that the patient no longer needed this paperwork.  Stated that the paperwork did not need to be filled out at this time.

## 2018-08-26 ENCOUNTER — Telehealth: Payer: Self-pay | Admitting: *Deleted

## 2018-08-29 ENCOUNTER — Telehealth: Payer: Self-pay | Admitting: *Deleted

## 2018-08-29 DIAGNOSIS — Z87891 Personal history of nicotine dependence: Secondary | ICD-10-CM

## 2018-08-29 DIAGNOSIS — R918 Other nonspecific abnormal finding of lung field: Secondary | ICD-10-CM

## 2018-08-29 DIAGNOSIS — Z122 Encounter for screening for malignant neoplasm of respiratory organs: Secondary | ICD-10-CM

## 2018-08-29 NOTE — Telephone Encounter (Signed)
Patient has been notified that lung screening follow up imaging is due soon and is agreeable to Ct being scheduled

## 2018-08-29 NOTE — Telephone Encounter (Signed)
Left message for patient to notify them that it is time to schedule low dose lung cancer screening CT scan followup. Instructed patient to call back to verify information prior to the scan being scheduled.  

## 2018-08-30 ENCOUNTER — Other Ambulatory Visit (INDEPENDENT_AMBULATORY_CARE_PROVIDER_SITE_OTHER): Payer: Self-pay | Admitting: Nurse Practitioner

## 2018-09-02 ENCOUNTER — Encounter
Admission: RE | Admit: 2018-09-02 | Discharge: 2018-09-02 | Disposition: A | Discharge status: Home / Self Care | Payer: Medicare HMO | Source: Ambulatory Visit | Attending: Vascular Surgery | Admitting: Vascular Surgery

## 2018-09-02 DIAGNOSIS — E785 Hyperlipidemia, unspecified: Secondary | ICD-10-CM | POA: Diagnosis not present

## 2018-09-02 DIAGNOSIS — Z01812 Encounter for preprocedural laboratory examination: Secondary | ICD-10-CM | POA: Insufficient documentation

## 2018-09-02 DIAGNOSIS — I771 Stricture of artery: Secondary | ICD-10-CM | POA: Diagnosis not present

## 2018-09-02 DIAGNOSIS — Z833 Family history of diabetes mellitus: Secondary | ICD-10-CM | POA: Diagnosis not present

## 2018-09-02 DIAGNOSIS — F17210 Nicotine dependence, cigarettes, uncomplicated: Secondary | ICD-10-CM | POA: Diagnosis not present

## 2018-09-02 DIAGNOSIS — Z886 Allergy status to analgesic agent status: Secondary | ICD-10-CM | POA: Diagnosis not present

## 2018-09-02 DIAGNOSIS — I70211 Atherosclerosis of native arteries of extremities with intermittent claudication, right leg: Secondary | ICD-10-CM | POA: Diagnosis not present

## 2018-09-02 DIAGNOSIS — E1151 Type 2 diabetes mellitus with diabetic peripheral angiopathy without gangrene: Secondary | ICD-10-CM | POA: Diagnosis not present

## 2018-09-02 DIAGNOSIS — J449 Chronic obstructive pulmonary disease, unspecified: Secondary | ICD-10-CM | POA: Diagnosis not present

## 2018-09-02 DIAGNOSIS — I10 Essential (primary) hypertension: Secondary | ICD-10-CM | POA: Diagnosis not present

## 2018-09-02 HISTORY — DX: Chronic obstructive pulmonary disease, unspecified: J44.9

## 2018-09-02 LAB — CREATININE, SERUM
CREATININE: 0.84 mg/dL (ref 0.61–1.24)
GFR calc non Af Amer: >60 mL/min (ref >60)

## 2018-09-02 LAB — BUN: BUN: 18 mg/dL (ref 8–23)

## 2018-09-05 ENCOUNTER — Encounter
Admission: RE | Disposition: A | Discharge status: Home / Self Care | Payer: Self-pay | Source: Home / Self Care | Attending: Vascular Surgery

## 2018-09-05 ENCOUNTER — Other Ambulatory Visit: Payer: Self-pay

## 2018-09-05 ENCOUNTER — Encounter: Payer: Self-pay | Admitting: *Deleted

## 2018-09-05 ENCOUNTER — Ambulatory Visit
Admission: RE | Admit: 2018-09-05 | Discharge: 2018-09-05 | Disposition: A | Discharge status: Home / Self Care | Payer: Medicare HMO | Attending: Vascular Surgery | Admitting: Vascular Surgery

## 2018-09-05 DIAGNOSIS — I70219 Atherosclerosis of native arteries of extremities with intermittent claudication, unspecified extremity: Secondary | ICD-10-CM

## 2018-09-05 DIAGNOSIS — I771 Stricture of artery: Secondary | ICD-10-CM | POA: Insufficient documentation

## 2018-09-05 DIAGNOSIS — J449 Chronic obstructive pulmonary disease, unspecified: Secondary | ICD-10-CM | POA: Insufficient documentation

## 2018-09-05 DIAGNOSIS — I70211 Atherosclerosis of native arteries of extremities with intermittent claudication, right leg: Secondary | ICD-10-CM | POA: Insufficient documentation

## 2018-09-05 DIAGNOSIS — I10 Essential (primary) hypertension: Secondary | ICD-10-CM | POA: Insufficient documentation

## 2018-09-05 DIAGNOSIS — E1151 Type 2 diabetes mellitus with diabetic peripheral angiopathy without gangrene: Secondary | ICD-10-CM | POA: Insufficient documentation

## 2018-09-05 DIAGNOSIS — Z886 Allergy status to analgesic agent status: Secondary | ICD-10-CM | POA: Insufficient documentation

## 2018-09-05 DIAGNOSIS — Z833 Family history of diabetes mellitus: Secondary | ICD-10-CM | POA: Insufficient documentation

## 2018-09-05 DIAGNOSIS — E785 Hyperlipidemia, unspecified: Secondary | ICD-10-CM | POA: Insufficient documentation

## 2018-09-05 DIAGNOSIS — F17210 Nicotine dependence, cigarettes, uncomplicated: Secondary | ICD-10-CM | POA: Insufficient documentation

## 2018-09-05 HISTORY — PX: LOWER EXTREMITY ANGIOGRAPHY: CATH118251

## 2018-09-05 LAB — GLUCOSE, CAPILLARY
Glucose-Capillary: 118 mg/dL — ABNORMAL HIGH (ref 70–99)
Glucose-Capillary: 131 mg/dL — ABNORMAL HIGH (ref 70–99)

## 2018-09-05 SURGERY — LOWER EXTREMITY ANGIOGRAPHY
Anesthesia: Moderate Sedation | Laterality: Right

## 2018-09-05 MED ORDER — FENTANYL CITRATE (PF) 100 MCG/2ML IJ SOLN
INTRAMUSCULAR | Status: DC | PRN
  Administered 2018-09-05: 25 ug via INTRAVENOUS
  Administered 2018-09-05: 50 ug via INTRAVENOUS

## 2018-09-05 MED ORDER — ONDANSETRON HCL 4 MG/2ML IJ SOLN: 4.0000 mg | Freq: Four times a day (QID) | INTRAMUSCULAR | Status: DC | PRN

## 2018-09-05 MED ORDER — MIDAZOLAM HCL 5 MG/5ML IJ SOLN
INTRAMUSCULAR | Status: AC
  Filled 2018-09-05: qty 5

## 2018-09-05 MED ORDER — SODIUM CHLORIDE 0.9 % IV SOLN
INTRAVENOUS | Status: DC
  Administered 2018-09-05: 07:00:00 via INTRAVENOUS

## 2018-09-05 MED ORDER — SODIUM CHLORIDE (PF) 0.9 % IJ SOLN
INTRAMUSCULAR | Status: AC
  Filled 2018-09-05: qty 50

## 2018-09-05 MED ORDER — CLOPIDOGREL BISULFATE 75 MG PO TABS: 75.0000 mg | ORAL_TABLET | Freq: Every day | ORAL | 11 refills | Status: AC

## 2018-09-05 MED ORDER — CLOPIDOGREL BISULFATE 75 MG PO TABS: 75.0000 mg | ORAL_TABLET | Freq: Every day | ORAL | Status: DC

## 2018-09-05 MED ORDER — LIDOCAINE-EPINEPHRINE (PF) 1 %-1:200000 IJ SOLN
INTRAMUSCULAR | Status: AC
  Filled 2018-09-05: qty 10

## 2018-09-05 MED ORDER — HYDROMORPHONE HCL 1 MG/ML IJ SOLN: 1.0000 mg | Freq: Once | INTRAMUSCULAR | Status: DC | PRN

## 2018-09-05 MED ORDER — SODIUM CHLORIDE 0.9% FLUSH: 3.0000 mL | INTRAVENOUS | Status: DC | PRN

## 2018-09-05 MED ORDER — HYDRALAZINE HCL 20 MG/ML IJ SOLN
5.0000 mg | INTRAMUSCULAR | Status: DC | PRN
  Administered 2018-09-05: 5 mg via INTRAVENOUS

## 2018-09-05 MED ORDER — NITROGLYCERIN 5 MG/ML IV SOLN
INTRAVENOUS | Status: AC
  Filled 2018-09-05: qty 10

## 2018-09-05 MED ORDER — HEPARIN SODIUM (PORCINE) 1000 UNIT/ML IJ SOLN
INTRAMUSCULAR | Status: AC
  Filled 2018-09-05: qty 1

## 2018-09-05 MED ORDER — ACETAMINOPHEN 325 MG PO TABS: 650.0000 mg | ORAL_TABLET | ORAL | Status: DC | PRN

## 2018-09-05 MED ORDER — HEPARIN (PORCINE) IN NACL 1000-0.9 UT/500ML-% IV SOLN
INTRAVENOUS | Status: AC
  Filled 2018-09-05: qty 1000

## 2018-09-05 MED ORDER — MIDAZOLAM HCL 2 MG/2ML IJ SOLN
INTRAMUSCULAR | Status: DC | PRN
  Administered 2018-09-05: 1 mg via INTRAVENOUS
  Administered 2018-09-05: 0.5 mg via INTRAVENOUS
  Administered 2018-09-05: 2 mg via INTRAVENOUS

## 2018-09-05 MED ORDER — DEXTROSE 5 % IV SOLN
2.0000 g | Freq: Once | INTRAVENOUS | Status: AC
  Administered 2018-09-05: 2 g via INTRAVENOUS
  Filled 2018-09-05: qty 20

## 2018-09-05 MED ORDER — HYDRALAZINE HCL 20 MG/ML IJ SOLN
INTRAMUSCULAR | Status: AC
  Filled 2018-09-05: qty 1

## 2018-09-05 MED ORDER — LABETALOL HCL 5 MG/ML IV SOLN: 10.0000 mg | INTRAVENOUS | Status: DC | PRN

## 2018-09-05 MED ORDER — HEPARIN SODIUM (PORCINE) 10000 UNIT/ML IJ SOLN
INTRAMUSCULAR | Status: AC
  Filled 2018-09-05: qty 1

## 2018-09-05 MED ORDER — IOPAMIDOL (ISOVUE-300) INJECTION 61%
INTRAVENOUS | Status: DC | PRN
  Administered 2018-09-05: 80 mL via INTRA_ARTERIAL

## 2018-09-05 MED ORDER — SODIUM CHLORIDE 0.9% FLUSH: 3.0000 mL | Freq: Two times a day (BID) | INTRAVENOUS | Status: DC

## 2018-09-05 MED ORDER — HEPARIN SODIUM (PORCINE) 1000 UNIT/ML IJ SOLN
INTRAMUSCULAR | Status: DC | PRN
  Administered 2018-09-05: 5000 [IU] via INTRAVENOUS

## 2018-09-05 MED ORDER — SODIUM CHLORIDE 0.9 % IV SOLN: INTRAVENOUS | Status: DC

## 2018-09-05 MED ORDER — SODIUM CHLORIDE 0.9 % IV SOLN: 250.0000 mL | INTRAVENOUS | Status: DC | PRN

## 2018-09-05 MED ORDER — FENTANYL CITRATE (PF) 100 MCG/2ML IJ SOLN
INTRAMUSCULAR | Status: AC
  Filled 2018-09-05: qty 2

## 2018-09-05 SURGICAL SUPPLY — 19 items
BALLN LUTONIX 5X150X130 (BALLOONS) ×3
BALLN LUTONIX 5X220X130 (BALLOONS) ×3
BALLN ULTRVRSE 3X150X150 (BALLOONS) ×3
BALLOON LUTONIX 5X150X130 (BALLOONS) IMPLANT
BALLOON LUTONIX 5X220X130 (BALLOONS) IMPLANT
BALLOON ULTRVRSE 3X150X150 (BALLOONS) IMPLANT
CATH BEACON 5 .038 100 VERT TP (CATHETERS) ×2 IMPLANT
CATH PIG 70CM (CATHETERS) ×2 IMPLANT
DEVICE PRESTO INFLATION (MISCELLANEOUS) ×2 IMPLANT
DEVICE STARCLOSE SE CLOSURE (Vascular Products) ×2 IMPLANT
GLIDEWIRE ADV .035X260CM (WIRE) ×2 IMPLANT
PACK ANGIOGRAPHY (CUSTOM PROCEDURE TRAY) ×3 IMPLANT
SHEATH ANL2 6FRX45 HC (SHEATH) ×2 IMPLANT
SHEATH BRITE TIP 5FRX11 (SHEATH) ×2 IMPLANT
STENT VIABAHN 6X250X120 (Permanent Stent) ×2 IMPLANT
SYR MEDRAD MARK V 150ML (SYRINGE) ×2 IMPLANT
TUBING CONTRAST HIGH PRESS 72 (TUBING) ×2 IMPLANT
WIRE G V18X300CM (WIRE) ×2 IMPLANT
WIRE J 3MM .035X145CM (WIRE) ×2 IMPLANT

## 2018-09-05 NOTE — Progress Notes (Signed)
Manual pressure released. Site dressed with 4 x 4 and Tegaderm  Site soft with scanty ecchymosis starting. Pt. Med. With hydralazine 5 mg slow IVP for BP.

## 2018-09-05 NOTE — H&P (Signed)
[facility] Admission History & Physical  MRN : [id number]  Angel Simon is a 68 y.o. (1951-07-22) male who presents with chief complaint of No chief complaint on file. Marland Kitchen  History of Present Illness: Patient presents with lifestyle limiting claudication symptoms of the right leg.  No ulcer or infection.  No fever or chills.  Has SOB with activity but nothing new.  Symptoms progressed over months to years.    Current Facility-Administered Medications  Medication Dose Route Frequency Provider Last Rate Last Dose  . 0.9 %  sodium chloride infusion   Intravenous Continuous Kris Hartmann, NP 75 mL/hr at 09/05/18 0720    . ceFAZolin (ANCEF) 2 g in dextrose 5 % 50 mL IVPB  2 g Intravenous Once Eulogio Ditch E, NP      . HYDROmorphone (DILAUDID) injection 1 mg  1 mg Intravenous Once PRN Kris Hartmann, NP      . ondansetron (ZOFRAN) injection 4 mg  4 mg Intravenous Q6H PRN Kris Hartmann, NP        Past Medical History:  Diagnosis Date  . COPD (chronic obstructive pulmonary disease) ([place])   . Diabetes mellitus without complication ([place])   . Hyperlipidemia   . Hypertension     Past Surgical History:  Procedure Laterality Date  . MOUTH SURGERY    . NO PAST SURGERIES      Social History Social History   Tobacco Use  . Smoking status: Current Every Day Smoker    Packs/day: 1.00    Years: 54.00    Pack years: 54.00    Types: Cigarettes  . Smokeless tobacco: Never Used  Substance Use Topics  . Alcohol use: No  . Drug use: No    Family History Family History  Problem Relation Age of Onset  . Diabetes Mother   . Cancer Father   . Cancer Sister     Allergies  Allergen Reactions  . Aspirin Other (See Comments)    Fulness in chest and upper abd.     REVIEW OF SYSTEMS (Negative unless checked)  Constitutional: [] Weight loss  [] Fever  [] Chills Cardiac: [] Chest pain   [] Chest pressure   [] Palpitations   [] Shortness of breath when laying  flat   [] Shortness of breath at rest   [x] Shortness of breath with exertion. Vascular:  [x] Pain in legs with walking   [] Pain in legs at rest   [] Pain in legs when laying flat   [x] Claudication   [] Pain in feet when walking  [] Pain in feet at rest  [] Pain in feet when laying flat   [] History of DVT   [] Phlebitis   [] Swelling in legs   [] Varicose veins   [] Non-healing ulcers Pulmonary:   [] Uses home oxygen   [] Productive cough   [] Hemoptysis   [] Wheeze  [x] COPD   [] Asthma Neurologic:  [] Dizziness  [] Blackouts   [] Seizures   [] History of stroke   [] History of TIA  [] Aphasia   [] Temporary blindness   [] Dysphagia   [] Weakness or numbness in arms   [] Weakness or numbness in legs Musculoskeletal:  [x] Arthritis   [] Joint swelling   [] Joint pain   [] Low back pain Hematologic:  [] Easy bruising  [] Easy bleeding   [] Hypercoagulable state   [] Anemic  [] Hepatitis Gastrointestinal:  [] Blood in stool   [] Vomiting blood  [x] Gastroesophageal reflux/heartburn   [] Difficulty swallowing. Genitourinary:  [] Chronic kidney disease   [] Difficult urination  [] Frequent urination  [] Burning with urination   [] Blood in urine Skin:  []   Rashes   [] Ulcers   [] Wounds Psychological:  [] History of anxiety   []  History of major depression.  Physical Examination  Vitals:   09/05/18 0712  Pulse: 87  Resp: 18  SpO2: 96%  Weight: 102.5 kg  Height: 5\' 9"  (1.753 m)   Body mass index is 33.37 kg/m. Gen: WD/WN, NAD Head: [place]/AT, No temporalis wasting.  Ear/Nose/Throat: Hearing grossly intact, nares w/o erythema or drainage, oropharynx w/o Erythema/Exudate,  Eyes: Conjunctiva clear, sclera non-icteric Neck: Trachea midline.  No JVD.  Pulmonary:  Good air movement, respirations not labored, no use of accessory muscles.  Cardiac: RRR, normal S1, S2. Vascular:  Vessel Right Left  Radial Palpable Palpable                          PT 1+ Palpable 1+ Palpable  DP 1+ Palpable Palpable    Musculoskeletal: M/S 5/5 throughout.   Extremities without ischemic changes.  No deformity or atrophy.  Neurologic: Sensation grossly intact in extremities.  Symmetrical.  Speech is fluent. Motor exam as listed above. Psychiatric: Judgment intact, Mood & affect appropriate for pt's clinical situation. Dermatologic: No rashes or ulcers noted.  No cellulitis or open wounds.      CBC No results found for: WBC, HGB, HCT, MCV, PLT  BMET    Component Value Date/Time   BUN 18 09/02/2018 1040   CREATININE 0.84 09/02/2018 1040   GFRNONAA >60 09/02/2018 1040   GFRAA >60 09/02/2018 1040   Estimated Creatinine Clearance: 99.3 mL/min (by C-G formula based on SCr of 0.84 mg/dL).  COAG No results found for: INR, PROTIME  Radiology No results found.    Assessment/Plan 1. PAD with claudication. Non-invasives suggest right SFA/popliteal disease and reduced ABIs. Sx are lifestyle limiting and he desires intervention.  Risks and benefits discussed 2. HTN. Stable on outpatient medications and blood pressure control important in reducing the progression of atherosclerotic disease. On appropriate oral medications. 3. DM. May also have a component of neuropathy and blood glucose control important in reducing the progression of atherosclerotic disease. Also, involved in wound healing. On appropriate medications.    Leotis Pain, MD  09/05/2018 8:35 AM

## 2018-09-05 NOTE — Op Note (Signed)
[facility]  Percutaneous Study/Intervention Procedural Note   Date of Surgery: 09/05/2018  Surgeon(s):DEW,JASON    Assistants:none  Pre-operative Diagnosis: PAD with claudication RLE  Post-operative diagnosis:  Same  Procedure(s) Performed:             1.  Ultrasound guidance for vascular access left femoral artery             2.  Catheter placement into right common femoral artery from left femoral approach             3.  Aortogram and selective right lower extremity angiogram             4.  Percutaneous transluminal angioplasty of right anterior tibial artery with 3 mm diameter by 15 cm length angioplasty balloon             5.   Percutaneous transluminal angioplasty of the right SFA with 5 mm diameter by 22 cm length and 5 mm diameter by 15 cm length Lutonix drug-coated angioplasty balloons  6.  Stent placement to the right SFA with a 6 mm diameter by 25 cm length Viabahn stent for multiple areas of greater than 50% stenosis after angioplasty             7.  StarClose closure device left femoral artery  EBL: 5 cc  Contrast: 80 cc  Fluoro Time: 4 minutes  Moderate Conscious Sedation Time: approximately 35 minutes using 3.5 mg of Versed and 75 mcg of Fentanyl              Indications:  Patient is a 68 y.o.male with lifestyle limiting claudication of the right leg. The patient has noninvasive study showing reduced ABI with SFA disease. The patient is brought in for angiography for further evaluation and potential treatment. Risks and benefits are discussed and informed consent is obtained.   Procedure:  The patient was identified and appropriate procedural time out was performed.  The patient was then placed supine on the table and prepped and draped in the usual sterile fashion. Moderate conscious sedation was administered during a face to face encounter with the patient throughout the procedure with my supervision of the RN administering medicines and  monitoring the patient's vital signs, pulse oximetry, telemetry and mental status throughout from the start of the procedure until the patient was taken to the recovery room. Ultrasound was used to evaluate the left common femoral artery.  It was patent .  A digital ultrasound image was acquired.  A Seldinger needle was used to access the left common femoral artery under direct ultrasound guidance and a permanent image was performed.  A 0.035 J wire was advanced without resistance and a 5Fr sheath was placed.  Pigtail catheter was placed into the aorta and an AP aortogram was performed. This demonstrated normal renal arteries and normal aorta and iliac segments without significant stenosis. I then crossed the aortic bifurcation and advanced to the right femoral head. Selective right lower extremity angiogram was then performed. This demonstrated normal common femoral artery, profunda femoris artery, but the superficial femoral artery was diseased from shortly beyond its origin and it was small and stenotic with an occlusion in the midsegment.  It reconstituted the distal SFA and the popliteal artery was fairly normal.  The anterior tibial artery had moderate stenosis near its origin in the 60 to 70% range.  It was then continuous to the foot.  The posterior tibial artery was widely patent and was  the dominant runoff to the foot. It was felt that it was in the patient's best interest to proceed with intervention after these images to avoid a second procedure and a larger amount of contrast and fluoroscopy based off of the findings from the initial angiogram. The patient was systemically heparinized and a 6 [place] Ansell sheath was then placed over the [employer] wire. I then used a Kumpe catheter and the advantage wire to navigate through the SFA occlusion and into the popliteal artery to confirm intraluminal flow and further evaluate the anterior tibial disease.  I then exchanged for a 0.018 wire and cross the  stenosis in the right anterior tibial artery without difficulty.  A 3 mm diameter by 15 cm length angioplasty balloon was then inflated in the proximal right anterior tibial artery to 10 atm for 1 minute.  To treat the SFA lesions, a 5 mm diameter by 22 cm length Lutonix drug-coated angioplasty balloon and a 5 mm diameter by 15 cm length Lutonix drug-coated angioplasty balloon were used to cover the entire lesion.  Both inflations were about 10 to 12 atm for 1 minute.  Completion imaging showed multiple areas of greater than 50% residual stenosis in the SFA and so I elected to treat these areas with a stent.  A 6 mm diameter by 25 cm length via bond stent was deployed from the proximal SFA down to the distal SFA and postdilated with a 5 mm balloon with excellent angiographic completion result and less than 10% residual stenosis.  There was significant spasm in the anterior tibial artery and intra-arterial nitroglycerin was given and then completion imaging showed only about a 20 to 25% residual stenosis in the right anterior tibial artery. I elected to terminate the procedure. The sheath was removed and StarClose closure device was deployed in the left femoral artery with excellent hemostatic result. The patient was taken to the recovery room in stable condition having tolerated the procedure well.  Findings:               Aortogram:  Normal renal arteries, normal aorta and iliac arteries without significant stenosis             Right lower Extremity:  Normal common femoral artery, profunda femoris artery, but the superficial femoral artery was diseased from shortly beyond its origin and it was small and stenotic with an occlusion in the midsegment.  It reconstituted the distal SFA and the popliteal artery was fairly normal.  The anterior tibial artery had moderate stenosis near its origin in the 60 to 70% range.  It was then continuous to the foot.  The posterior tibial artery was widely patent and was the  dominant runoff to the foot   Disposition: Patient was taken to the recovery room in stable condition having tolerated the procedure well.  Complications: None  Leotis Pain 09/05/2018 10:06 AM   This note was created with Dragon Medical transcription system. Any errors in dictation are purely unintentional.

## 2018-09-05 NOTE — Progress Notes (Signed)
Pt. Sat up at bedside to urinate. Left groin immediately saturated with Bright red blood. Immediate pressure held to LFA. Call to Dr. Lucky Cowboy- made aware of situation. MD states "keep pt. Down for another hour. Manual pressue held by S. Owens Shark, RN continuous. Pt. VSS; tolerating pressure being held "well." ( pt. Conversive, calm).

## 2018-09-06 ENCOUNTER — Encounter: Payer: Self-pay | Admitting: Vascular Surgery

## 2018-09-08 ENCOUNTER — Telehealth (INDEPENDENT_AMBULATORY_CARE_PROVIDER_SITE_OTHER): Payer: Self-pay

## 2018-09-08 NOTE — Telephone Encounter (Signed)
Spoke with the patient's wife and gave her Dr. Bunnie Domino recommendations. See note below.

## 2018-09-08 NOTE — Telephone Encounter (Signed)
Patient's wife called stating the patient is complaining of upper thigh pain after his angio on 09/05/2018, the patient is not experiencing any other symptoms.

## 2018-09-14 ENCOUNTER — Ambulatory Visit
Admission: RE | Admit: 2018-09-14 | Discharge: 2018-09-14 | Disposition: A | Discharge status: Home / Self Care | Payer: Medicare HMO | Source: Ambulatory Visit | Attending: Oncology | Admitting: Oncology

## 2018-09-14 DIAGNOSIS — Z87891 Personal history of nicotine dependence: Secondary | ICD-10-CM | POA: Insufficient documentation

## 2018-09-14 DIAGNOSIS — R918 Other nonspecific abnormal finding of lung field: Secondary | ICD-10-CM | POA: Diagnosis present

## 2018-09-14 DIAGNOSIS — Z122 Encounter for screening for malignant neoplasm of respiratory organs: Secondary | ICD-10-CM | POA: Diagnosis present

## 2018-09-15 ENCOUNTER — Telehealth: Payer: Self-pay | Admitting: *Deleted

## 2018-09-15 ENCOUNTER — Encounter: Payer: Self-pay | Admitting: *Deleted

## 2018-09-15 NOTE — Telephone Encounter (Signed)
   Notified patient of LDCT lung cancer screening program results with recommendation for 12 month follow up imaging.  Also notified of incidental findings noted below and is encouraged to discuss further questions with PCP who will receive a copy of this not and/or the CT reports.  Patient verbalized understanding.     IMPRESSION: 1. Dominant right middle lobe nodule is smoothly marginated and stable from 06/06/2018. Lung-RADS 2, benign appearance or behavior. Continue annual screening with low-dose chest CT without contrast in 12 months. 2. 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 3. Hypertrophy of the left hepatic lobe can be seen in early cirrhosis.  4. Left adrenal adenoma. 5. Aortic atherosclerosis (ICD10-170.0). Coronary artery calcification. 6.  Emphysema (ICD10-J43.9).

## 2018-10-11 ENCOUNTER — Other Ambulatory Visit (INDEPENDENT_AMBULATORY_CARE_PROVIDER_SITE_OTHER): Payer: Self-pay | Admitting: Vascular Surgery

## 2018-10-11 ENCOUNTER — Other Ambulatory Visit (INDEPENDENT_AMBULATORY_CARE_PROVIDER_SITE_OTHER): Payer: Medicare HMO

## 2018-10-11 ENCOUNTER — Other Ambulatory Visit: Payer: Self-pay

## 2018-10-11 ENCOUNTER — Encounter (INDEPENDENT_AMBULATORY_CARE_PROVIDER_SITE_OTHER): Payer: Self-pay | Admitting: Vascular Surgery

## 2018-10-11 ENCOUNTER — Ambulatory Visit (INDEPENDENT_AMBULATORY_CARE_PROVIDER_SITE_OTHER): Payer: Medicare HMO | Admitting: Vascular Surgery

## 2018-10-11 VITALS — BP 191/64 | HR 72 | Resp 10 | Ht 69.0 in | Wt 225.0 lb

## 2018-10-11 DIAGNOSIS — E1151 Type 2 diabetes mellitus with diabetic peripheral angiopathy without gangrene: Secondary | ICD-10-CM

## 2018-10-11 DIAGNOSIS — I739 Peripheral vascular disease, unspecified: Secondary | ICD-10-CM

## 2018-10-11 DIAGNOSIS — E782 Mixed hyperlipidemia: Secondary | ICD-10-CM | POA: Diagnosis not present

## 2018-10-11 DIAGNOSIS — E119 Type 2 diabetes mellitus without complications: Secondary | ICD-10-CM

## 2018-10-11 DIAGNOSIS — Z95820 Peripheral vascular angioplasty status with implants and grafts: Secondary | ICD-10-CM

## 2018-10-11 DIAGNOSIS — I10 Essential (primary) hypertension: Secondary | ICD-10-CM

## 2018-10-11 DIAGNOSIS — F17210 Nicotine dependence, cigarettes, uncomplicated: Secondary | ICD-10-CM

## 2018-10-11 NOTE — Progress Notes (Signed)
MRN : [id number]  Angel Simon is a 68 y.o. (1950-10-04) male who presents with chief complaint of  Chief Complaint  Patient presents with  . Follow-up  .  History of Present Illness: Patient returns today in follow up of his PAD.  He says his right leg feels way better than it did prior to his intervention last month.  He had no periprocedural complications.  His ABIs today are 0.8 on the right and 0.76 on the left with normal digital pressures bilaterally.  It is improved some on the right from his previous study.  Current Outpatient Medications  Medication Sig Dispense Refill  . clopidogrel (PLAVIX) 75 MG tablet Take 1 tablet (75 mg total) by mouth daily. 30 tablet 11  . lisinopril (PRINIVIL,ZESTRIL) 40 MG tablet     . metFORMIN (GLUCOPHAGE-XR) 500 MG 24 hr tablet Take by mouth.    . metoprolol tartrate (LOPRESSOR) 50 MG tablet Take 50 mg by mouth 2 (two) times daily.    . simvastatin (ZOCOR) 20 MG tablet Take by mouth.     No current facility-administered medications for this visit.     Past Medical History:  Diagnosis Date  . COPD (chronic obstructive pulmonary disease) ([place])   . Diabetes mellitus without complication ([place])   . Hyperlipidemia   . Hypertension     Past Surgical History:  Procedure Laterality Date  . LOWER EXTREMITY ANGIOGRAPHY Right 09/05/2018   Procedure: LOWER EXTREMITY ANGIOGRAPHY;  Surgeon: Algernon Huxley, MD;  Location: [facility];  Service: Cardiovascular;  Laterality: Right;  . MOUTH SURGERY    . NO PAST SURGERIES      Social History Social History   Tobacco Use  . Smoking status: Current Every Day Smoker    Packs/day: 1.00    Years: 54.00    Pack years: 54.00    Types: Cigarettes  . Smokeless tobacco: Never Used  Substance Use Topics  . Alcohol use: No  . Drug use: No    Family History Family History  Problem Relation Age of Onset  . Diabetes Mother   . Cancer Father   . Cancer Sister     Allergies  Allergen  Reactions  . Aspirin Other (See Comments)    Fulness in chest and upper abd.     REVIEW OF SYSTEMS (Negative unless checked)  Constitutional: [] Weight loss  [] Fever  [] Chills Cardiac: [] Chest pain   [] Chest pressure   [] Palpitations   [] Shortness of breath when laying flat   [] Shortness of breath at rest   [] Shortness of breath with exertion. Vascular:  [] Pain in legs with walking   [] Pain in legs at rest   [] Pain in legs when laying flat   [x] Claudication   [] Pain in feet when walking  [] Pain in feet at rest  [] Pain in feet when laying flat   [] History of DVT   [] Phlebitis   [] Swelling in legs   [] Varicose veins   [] Non-healing ulcers Pulmonary:   [] Uses home oxygen   [] Productive cough   [] Hemoptysis   [] Wheeze  [] COPD   [] Asthma Neurologic:  [] Dizziness  [] Blackouts   [] Seizures   [] History of stroke   [] History of TIA  [] Aphasia   [] Temporary blindness   [] Dysphagia   [] Weakness or numbness in arms   [] Weakness or numbness in legs Musculoskeletal:  [x] Arthritis   [] Joint swelling   [] Joint pain   [] Low back pain Hematologic:  [] Easy bruising  [] Easy bleeding   [] Hypercoagulable state   []   Anemic   Gastrointestinal:  [] Blood in stool   [] Vomiting blood  [] Gastroesophageal reflux/heartburn   [] Abdominal pain Genitourinary:  [] Chronic kidney disease   [] Difficult urination  [] Frequent urination  [] Burning with urination   [] Hematuria Skin:  [] Rashes   [] Ulcers   [] Wounds Psychological:  [] History of anxiety   []  History of major depression.  Physical Examination  BP (!) 191/64 (BP Location: Left Arm, Patient Position: Sitting, Cuff Size: Small)   Pulse 72   Resp 10   Ht 5\' 9"  (1.753 m)   Wt 225 lb (102.1 kg)   BMI 33.23 kg/m  Gen:  WD/WN, NAD Head: [place]/AT, No temporalis wasting. Ear/Nose/Throat: Hearing grossly intact, nares w/o erythema or drainage Eyes: Conjunctiva clear. Sclera non-icteric Neck: Supple.  Trachea midline Pulmonary:  Good air movement, no use of accessory muscles.    Cardiac: RRR, no JVD Vascular:  Vessel Right Left  Radial Palpable Palpable                          PT  1+ palpable Palpable  DP Palpable  1+ palpable    Musculoskeletal: M/S 5/5 throughout.  No deformity or atrophy.  Trace lower extremity edema. Neurologic: Sensation grossly intact in extremities.  Symmetrical.  Speech is fluent.  Psychiatric: Judgment intact, Mood & affect appropriate for pt's clinical situation. Dermatologic: No rashes or ulcers noted.  No cellulitis or open wounds.  Access site is well-healed       Labs Recent Results (from the past 2160 hour(s))  BUN     Status: None   Collection Time: 09/02/18 10:40 AM  Result Value Ref Range   BUN 18 8 - 23 mg/dL    Comment: Performed at [facility], [address] [place], [place] [address]  Creatinine, serum     Status: None   Collection Time: 09/02/18 10:40 AM  Result Value Ref Range   Creatinine, Ser 0.84 0.61 - 1.24 mg/dL   GFR calc non Af Amer >60 >60 mL/min   GFR calc Af Amer >60 >60 mL/min    Comment: Performed at [facility], [address] [place], [place] [address]  Glucose, capillary     Status: Abnormal   Collection Time: 09/05/18  7:13 AM  Result Value Ref Range   Glucose-Capillary 118 (H) 70 - 99 mg/dL  Glucose, capillary     Status: Abnormal   Collection Time: 09/05/18 10:10 AM  Result Value Ref Range   Glucose-Capillary 131 (H) 70 - 99 mg/dL    Radiology Ct Chest Lcs Nodule F/u W/o Contrast  Result Date: 09/14/2018 CLINICAL DATA:  Current smoker, 55 pack-year history. EXAM: CT CHEST WITHOUT CONTRAST FOR LUNG CANCER SCREENING NODULE FOLLOW-UP TECHNIQUE: Multidetector CT imaging of the chest was performed following the standard protocol without IV contrast. COMPARISON:  06/06/2018. FINDINGS: Cardiovascular: Atherosclerotic calcification of the aorta, aortic valve and coronary arteries. Heart size normal. No pericardial effusion. 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 No pathologically enlarged mediastinal lymph nodes. Calcified left hilar lymph nodes. No axillary adenopathy. Esophagus is grossly unremarkable. Lungs/Pleura: Centrilobular emphysema. Smoking related respiratory bronchiolitis. Scattered pulmonary parenchymal scarring. Smoothly marginated nodule in the medial right middle lobe measures 10.5 mm, stable. Additional pulmonary nodules measure 6.4 mm or less in size, similar. Calcified granulomas. No pleural fluid. Airway is unremarkable. Upper Abdomen: Hypertrophy of left hepatic lobe. Visualized portions of the liver, gallbladder and adrenal glands are  otherwise unremarkable. Fluid density lesion in the left adrenal gland measures 2.4 cm, as before. Visualized portions of the kidneys, spleen, pancreas, stomach and bowel are grossly unremarkable. No upper abdominal adenopathy. Musculoskeletal: Degenerative changes in the spine. No worrisome lytic or sclerotic lesions. IMPRESSION: 1. Dominant right middle lobe nodule is smoothly marginated and stable from 06/06/2018. Lung-RADS 2, benign appearance or behavior. Continue annual screening with low-dose chest CT without contrast in 12 months. 2. 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 3. Hypertrophy of the left hepatic lobe can be seen in early cirrhosis. 4. Left adrenal adenoma. 5. Aortic atherosclerosis (ICD10-170.0). Coronary artery calcification. 6.  Emphysema (ICD10-J43.9). Electronically Signed   By: Lorin Picket M.D.   On: 09/14/2018 11:23    Assessment/Plan  Type 2 diabetes mellitus without complication, without long-term current use of insulin (HCC) blood glucose control important in reducing the progression of atherosclerotic disease. Also, involved in wound healing. On appropriate medications.   Hyperlipidemia, mixed lipid control important in reducing the progression of atherosclerotic disease. Continue statin  therapy   Essential hypertension blood pressure control important in reducing the progression of atherosclerotic disease. On appropriate oral medications.   PAD (peripheral artery disease) (HCC) His ABIs today are 0.8 on the right and 0.76 on the left with normal digital pressures bilaterally.  It is improved some on the right from his previous study.  Symptoms are markedly improved after right lower extremity intervention.  Has some claudication symptoms now on the left, but says they are not lifestyle limiting.  Continue current medical regimen.  Recheck in 3 to 4 months.    Leotis Pain, MD  10/11/2018 9:10 AM    This note was created with Dragon medical transcription system.  Any errors from dictation are purely unintentional

## 2018-10-11 NOTE — Assessment & Plan Note (Signed)
blood pressure control important in reducing the progression of atherosclerotic disease. On appropriate oral medications.  

## 2018-10-11 NOTE — Assessment & Plan Note (Signed)
lipid control important in reducing the progression of atherosclerotic disease. Continue statin therapy  

## 2018-10-11 NOTE — Assessment & Plan Note (Signed)
His ABIs today are 0.8 on the right and 0.76 on the left with normal digital pressures bilaterally.  It is improved some on the right from his previous study.  Symptoms are markedly improved after right lower extremity intervention.  Has some claudication symptoms now on the left, but says they are not lifestyle limiting.  Continue current medical regimen.  Recheck in 3 to 4 months.

## 2018-10-11 NOTE — Assessment & Plan Note (Signed)
blood glucose control important in reducing the progression of atherosclerotic disease. Also, involved in wound healing. On appropriate medications.  

## 2019-02-10 ENCOUNTER — Ambulatory Visit (INDEPENDENT_AMBULATORY_CARE_PROVIDER_SITE_OTHER): Payer: Medicare HMO | Admitting: Vascular Surgery

## 2019-02-10 ENCOUNTER — Encounter (INDEPENDENT_AMBULATORY_CARE_PROVIDER_SITE_OTHER): Payer: Self-pay | Admitting: Vascular Surgery

## 2019-02-10 ENCOUNTER — Other Ambulatory Visit: Payer: Self-pay

## 2019-02-10 ENCOUNTER — Ambulatory Visit (INDEPENDENT_AMBULATORY_CARE_PROVIDER_SITE_OTHER): Payer: Medicare HMO

## 2019-02-10 VITALS — BP 169/81 | HR 66 | Resp 15 | Wt 225.0 lb

## 2019-02-10 DIAGNOSIS — Z95820 Peripheral vascular angioplasty status with implants and grafts: Secondary | ICD-10-CM | POA: Diagnosis not present

## 2019-02-10 DIAGNOSIS — I10 Essential (primary) hypertension: Secondary | ICD-10-CM | POA: Diagnosis not present

## 2019-02-10 DIAGNOSIS — E119 Type 2 diabetes mellitus without complications: Secondary | ICD-10-CM

## 2019-02-10 DIAGNOSIS — Z720 Tobacco use: Secondary | ICD-10-CM

## 2019-02-10 DIAGNOSIS — E782 Mixed hyperlipidemia: Secondary | ICD-10-CM

## 2019-02-10 DIAGNOSIS — I739 Peripheral vascular disease, unspecified: Secondary | ICD-10-CM

## 2019-02-10 DIAGNOSIS — Z79899 Other long term (current) drug therapy: Secondary | ICD-10-CM

## 2019-02-10 DIAGNOSIS — F17210 Nicotine dependence, cigarettes, uncomplicated: Secondary | ICD-10-CM

## 2019-02-10 DIAGNOSIS — Z7984 Long term (current) use of oral hypoglycemic drugs: Secondary | ICD-10-CM

## 2019-02-10 NOTE — Assessment & Plan Note (Signed)
ABIs today are 0.88 on the right and 0.94 on the left with biphasic waveforms on the right and triphasic waveforms on the left.  Overall doing well.  Smoking cessation again recommended.  Refill for Plavix given.  Return in 6 months with noninvasive studies.

## 2019-02-10 NOTE — Assessment & Plan Note (Signed)
We had a discussion for approximately 3-4 minutes regarding the absolute need for smoking cessation due to the deleterious nature of tobacco on the vascular system. We discussed the tobacco use would diminish patency of any intervention, and likely significantly worsen progressio of disease. We discussed multiple agents for quitting including replacement therapy or medications to reduce cravings such as Chantix. The patient voices their understanding of the importance of smoking cessation.  

## 2019-02-10 NOTE — Progress Notes (Signed)
MRN : [id number]  Angel Simon is a 68 y.o. ([birth year]) male who presents with chief complaint of  Chief Complaint  Patient presents with  . Follow-up    58month abi follow up  .  History of Present Illness: Patient returns today in follow up of his peripheral arterial disease.  He is doing quite well after right lower extremity revascularization 5 to 6 months ago.  He has no complaints today.  His claudication symptoms are essentially resolved.  He does continue to smoke. ABIs today are 0.88 on the right and 0.94 on the left with biphasic waveforms on the right and triphasic waveforms on the left.  Current Outpatient Medications  Medication Sig Dispense Refill  . clopidogrel (PLAVIX) 75 MG tablet Take 1 tablet (75 mg total) by mouth daily. 30 tablet 11  . lisinopril (PRINIVIL,ZESTRIL) 40 MG tablet     . metoprolol tartrate (LOPRESSOR) 50 MG tablet Take 50 mg by mouth 2 (two) times daily.    . simvastatin (ZOCOR) 20 MG tablet Take by mouth.    . metFORMIN (GLUCOPHAGE-XR) 500 MG 24 hr tablet Take by mouth.     No current facility-administered medications for this visit.     Past Medical History:  Diagnosis Date  . COPD (chronic obstructive pulmonary disease) ([place])   . Diabetes mellitus without complication ([place])   . Hyperlipidemia   . Hypertension     Past Surgical History:  Procedure Laterality Date  . LOWER EXTREMITY ANGIOGRAPHY Right 09/05/2018   Procedure: LOWER EXTREMITY ANGIOGRAPHY;  Surgeon: Algernon Huxley, MD;  Location: [facility];  Service: Cardiovascular;  Laterality: Right;  . MOUTH SURGERY    . NO PAST SURGERIES     Social History        Tobacco Use  . Smoking status: Current Every Day Smoker    Packs/day: 1.00    Years: 54.00    Pack years: 54.00    Types: Cigarettes  . Smokeless tobacco: Never Used  Substance Use Topics  . Alcohol use: No  . Drug use: No    Family History      Family History  Problem Relation Age of Onset  .  Diabetes Mother   . Cancer Father   . Cancer Sister          Allergies  Allergen Reactions  . Aspirin Other (See Comments)    Fulness in chest and upper abd.     REVIEW OF SYSTEMS (Negative unless checked)  Constitutional: [] ?Weight loss  [] ?Fever  [] ?Chills Cardiac: [] ?Chest pain   [] ?Chest pressure   [] ?Palpitations   [] ?Shortness of breath when laying flat   [] ?Shortness of breath at rest   [] ?Shortness of breath with exertion. Vascular:  [] ?Pain in legs with walking   [] ?Pain in legs at rest   [] ?Pain in legs when laying flat   [x] ?Claudication   [] ?Pain in feet when walking  [] ?Pain in feet at rest  [] ?Pain in feet when laying flat   [] ?History of DVT   [] ?Phlebitis   [] ?Swelling in legs   [] ?Varicose veins   [] ?Non-healing ulcers Pulmonary:   [] ?Uses home oxygen   [] ?Productive cough   [] ?Hemoptysis   [] ?Wheeze  [] ?COPD   [] ?Asthma Neurologic:  [] ?Dizziness  [] ?Blackouts   [] ?Seizures   [] ?History of stroke   [] ?History of TIA  [] ?Aphasia   [] ?Temporary blindness   [] ?Dysphagia   [] ?Weakness or numbness in arms   [] ?Weakness or numbness in legs Musculoskeletal:  [x] ?Arthritis   [] ?  Joint swelling   [] ?Joint pain   [] ?Low back pain Hematologic:  [] ?Easy bruising  [] ?Easy bleeding   [] ?Hypercoagulable state   [] ?Anemic   Gastrointestinal:  [] ?Blood in stool   [] ?Vomiting blood  [] ?Gastroesophageal reflux/heartburn   [] ?Abdominal pain Genitourinary:  [] ?Chronic kidney disease   [] ?Difficult urination  [] ?Frequent urination  [] ?Burning with urination   [] ?Hematuria Skin:  [] ?Rashes   [] ?Ulcers   [] ?Wounds Psychological:  [] ?History of anxiety   [] ? History of major depression.    Physical Examination  BP (!) 169/81 (BP Location: Right Arm)   Pulse 66   Resp 15   Wt 225 lb (102.1 kg)   BMI 33.23 kg/m  Gen:  WD/WN, NAD Head: [place]/AT, No temporalis wasting. Ear/Nose/Throat: Hearing grossly intact, nares w/o erythema or drainage Eyes: Conjunctiva clear. Sclera non-icteric  Neck: Supple.  Trachea midline Pulmonary:  Good air movement, no use of accessory muscles.  Cardiac: RRR, no JVD Vascular:  Vessel Right Left  Radial Palpable Palpable                          PT Palpable Palpable  DP Palpable Palpable   Gastrointestinal: soft, non-tender/non-distended. No guarding/reflex.  Musculoskeletal: M/S 5/5 throughout.  No deformity or atrophy. No edema. Neurologic: Sensation grossly intact in extremities.  Symmetrical.  Speech is fluent.  Psychiatric: Judgment intact, Mood & affect appropriate for pt's clinical situation. Dermatologic: No rashes or ulcers noted.  No cellulitis or open wounds.       Labs No results found for this or any previous visit (from the past 2160 hour(s)).  Radiology No results found.  Assessment/Plan Type 2 diabetes mellitus without complication, without long-term current use of insulin (HCC) blood glucose control important in reducing the progression of atherosclerotic disease. Also, involved in wound healing. On appropriate medications.   Hyperlipidemia, mixed lipid control important in reducing the progression of atherosclerotic disease. Continue statin therapy   Essential hypertension blood pressure control important in reducing the progression of atherosclerotic disease. On appropriate oral medications.  Tobacco abuse We had a discussion for approximately 3-4 minutes regarding the absolute need for smoking cessation due to the deleterious nature of tobacco on the vascular system. We discussed the tobacco use would diminish patency of any intervention, and likely significantly worsen progressio of disease. We discussed multiple agents for quitting including replacement therapy or medications to reduce cravings such as Chantix. The patient voices their understanding of the importance of smoking cessation.  PAD (peripheral artery disease) (HCC) ABIs today are 0.88 on the right and 0.94 on the left with biphasic  waveforms on the right and triphasic waveforms on the left.  Overall doing well.  Smoking cessation again recommended.  Refill for Plavix given.  Return in 6 months with noninvasive studies.    Leotis Pain, MD  02/10/2019 10:36 AM    This note was created with Dragon medical transcription system.  Any errors from dictation are purely unintentional

## 2019-02-14 ENCOUNTER — Other Ambulatory Visit: Payer: Self-pay | Admitting: Family Medicine

## 2019-02-14 DIAGNOSIS — I10 Essential (primary) hypertension: Secondary | ICD-10-CM

## 2019-08-11 ENCOUNTER — Encounter (INDEPENDENT_AMBULATORY_CARE_PROVIDER_SITE_OTHER): Payer: Medicare HMO

## 2019-08-11 ENCOUNTER — Ambulatory Visit (INDEPENDENT_AMBULATORY_CARE_PROVIDER_SITE_OTHER): Payer: Medicare HMO | Admitting: Vascular Surgery

## 2019-09-08 ENCOUNTER — Telehealth: Payer: Self-pay | Admitting: *Deleted

## 2019-09-08 NOTE — Telephone Encounter (Signed)
Left message for patient to notify them that it is time to schedule annual low dose lung cancer screening CT scan. Instructed patient to call back to verify information prior to the scan being scheduled.  

## 2019-09-22 ENCOUNTER — Telehealth: Payer: Self-pay | Admitting: *Deleted

## 2019-09-22 DIAGNOSIS — Z87891 Personal history of nicotine dependence: Secondary | ICD-10-CM

## 2019-09-22 NOTE — Telephone Encounter (Signed)
Patient has been notified that annual lung cancer screening low dose CT scan is due currently or will be in near future. Confirmed that patient is within the age range of 55-77, and asymptomatic, (no signs or symptoms of lung cancer). Patient denies illness that would prevent curative treatment for lung cancer if found. Verified smoking history, (current, 55 pack year). The shared decision making visit was done 06/06/18. Patient is agreeable for CT scan being scheduled.

## 2019-10-06 ENCOUNTER — Other Ambulatory Visit: Payer: Self-pay

## 2019-10-06 ENCOUNTER — Ambulatory Visit
Admission: RE | Admit: 2019-10-06 | Discharge: 2019-10-06 | Disposition: A | Discharge status: Home / Self Care | Payer: Medicare HMO | Source: Ambulatory Visit | Attending: Oncology | Admitting: Oncology

## 2019-10-06 DIAGNOSIS — Z87891 Personal history of nicotine dependence: Secondary | ICD-10-CM | POA: Insufficient documentation

## 2019-10-10 ENCOUNTER — Encounter: Payer: Self-pay | Admitting: *Deleted

## 2019-11-16 IMAGING — CT CT CHEST LUNG CANCER SCREENING LOW DOSE W/O CM
2 of 5 series · 15 of 40 positions shown, 18 images · non-contrast
Comparison: None.

CLINICAL DATA: 66-year-old male with 54 pack-year history of
smoking. Lung cancer screening.

EXAM:
CT CHEST WITHOUT CONTRAST LOW-DOSE FOR LUNG CANCER SCREENING
TECHNIQUE: Multidetector CT imaging of the chest was performed following the
standard protocol without IV contrast.

[Series 3: lung · axial · 0.78mm/px · z∈[-1264,-927]mm · 12 of 377 slices shown, 15 images (1 of 2)]
[im 20/377  mediastinal]
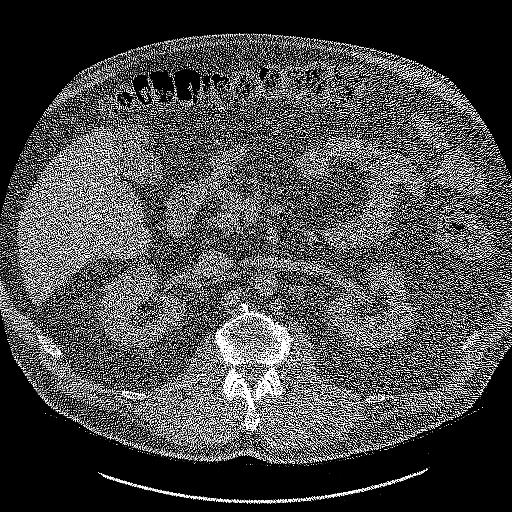
[im 20/377  lung]
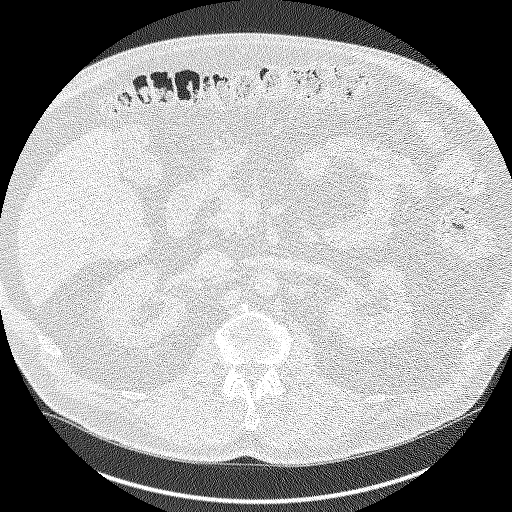
[im 60/377  lung]
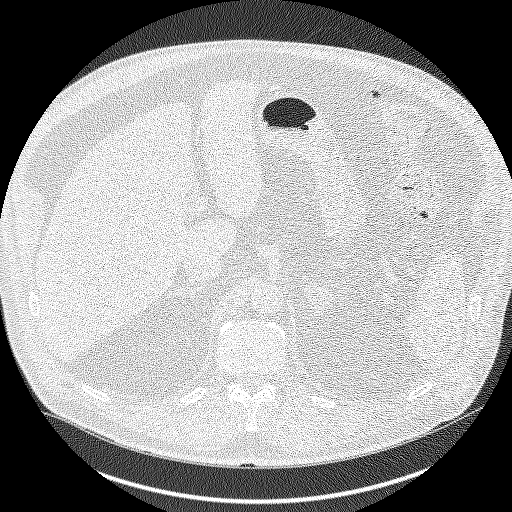
[im 80/377  lung]
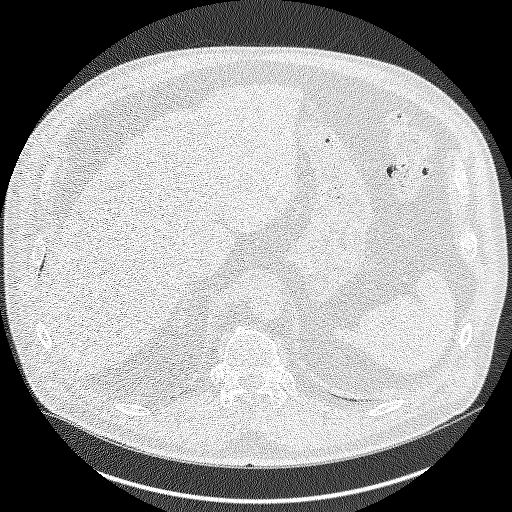
[im 119/377  lung]
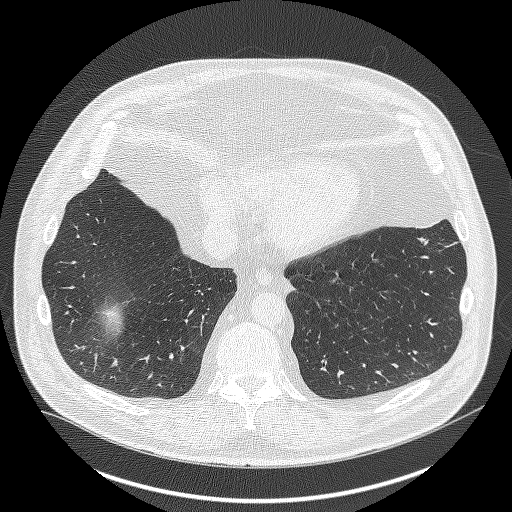
[im 139/377  mediastinal]
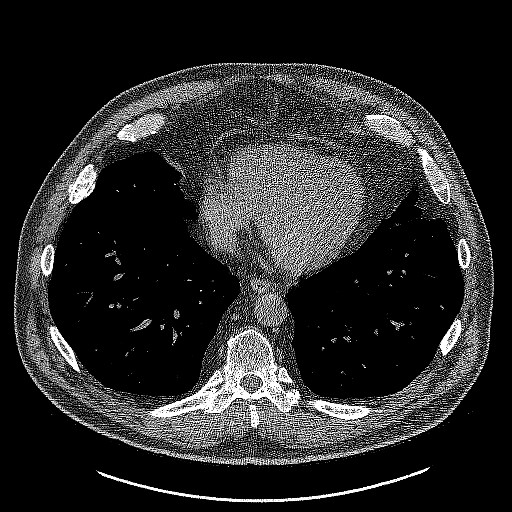
[im 139/377  lung]
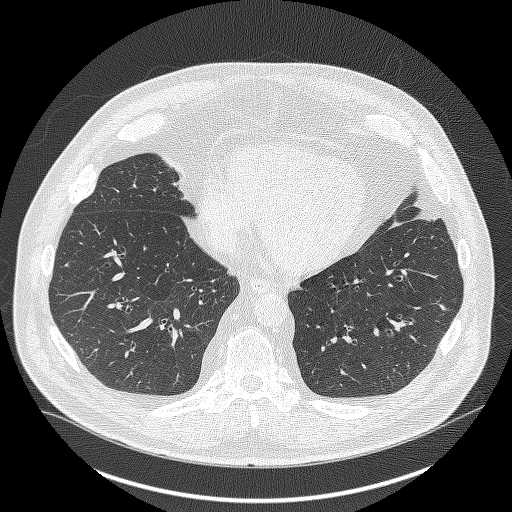
[im 179/377  lung]
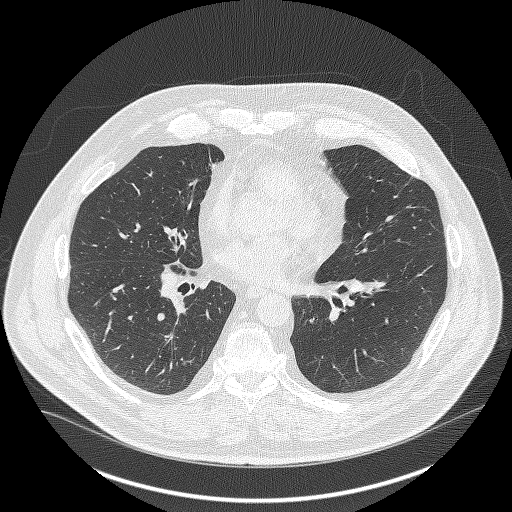
[im 198/377  lung]
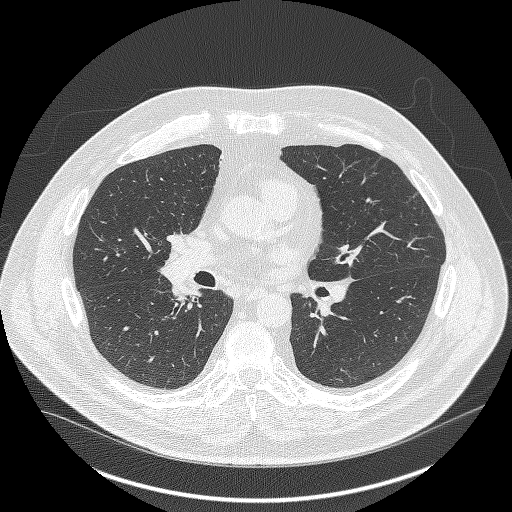
[im 238/377  lung]
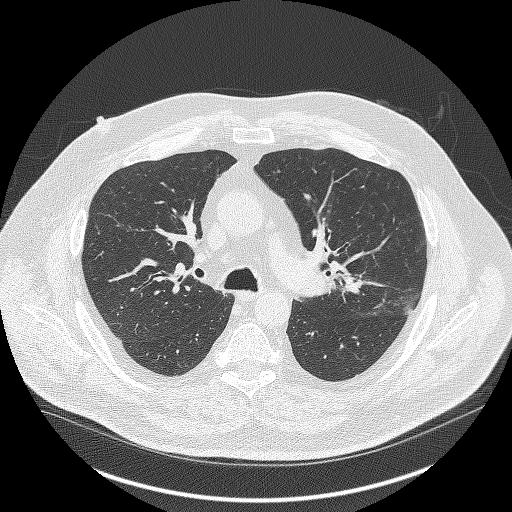
[im 258/377  mediastinal]
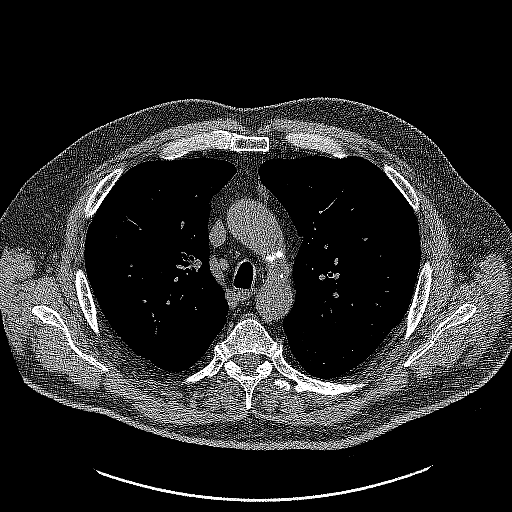
[im 258/377  lung]
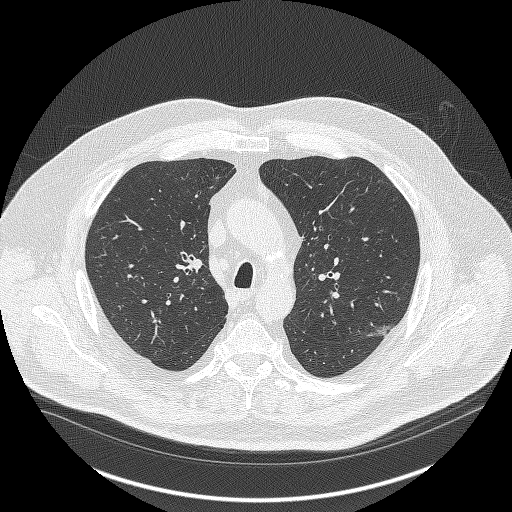
[im 297/377  lung]
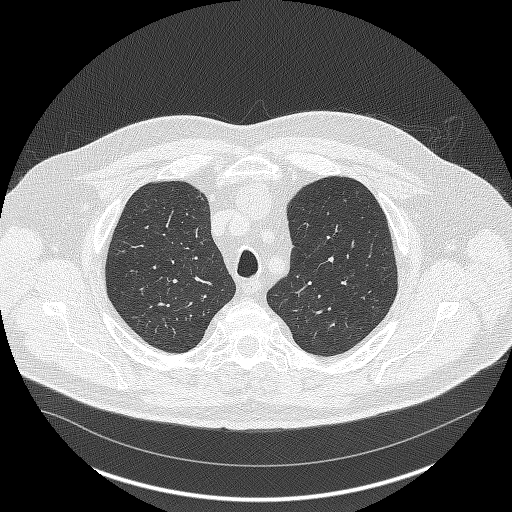
[im 317/377  lung]
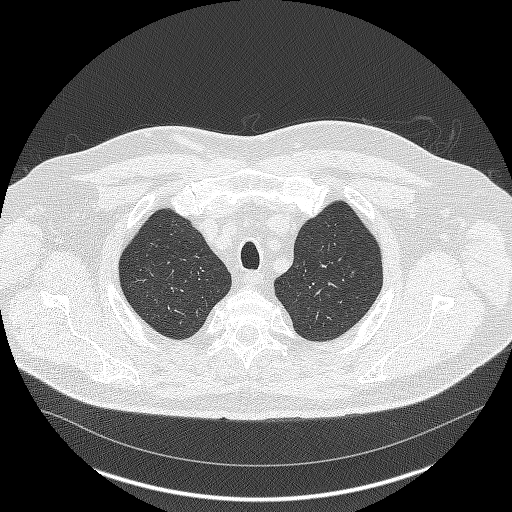
[im 357/377  lung]
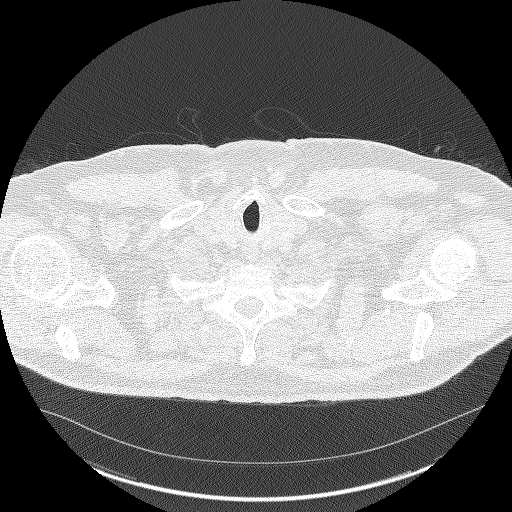

[Series 4: lung · coronal · 0.74mm/px · 3 of 346 slices shown (2 of 2)]
[im 70/346  lung]
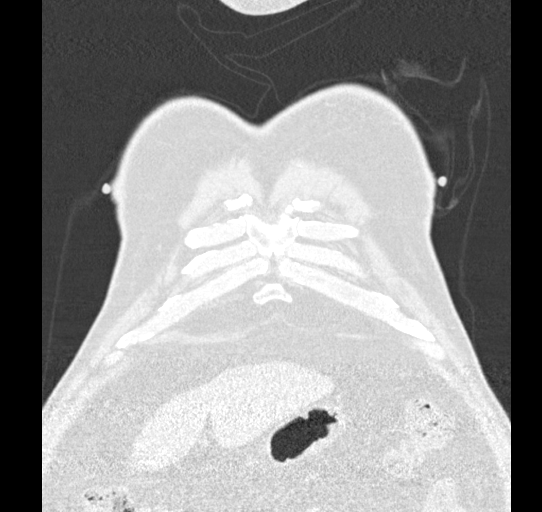
[im 139/346  lung]
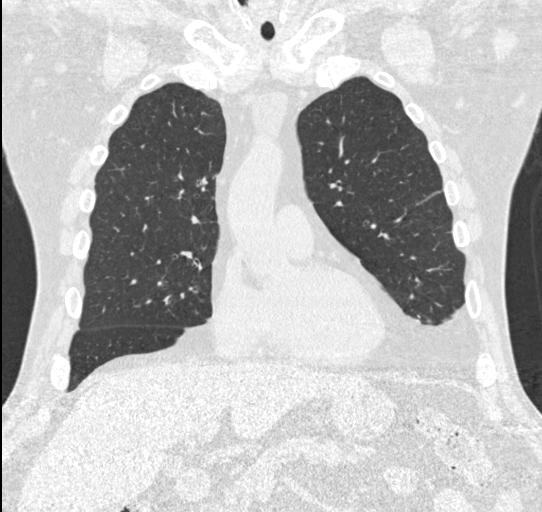
[im 208/346  lung]
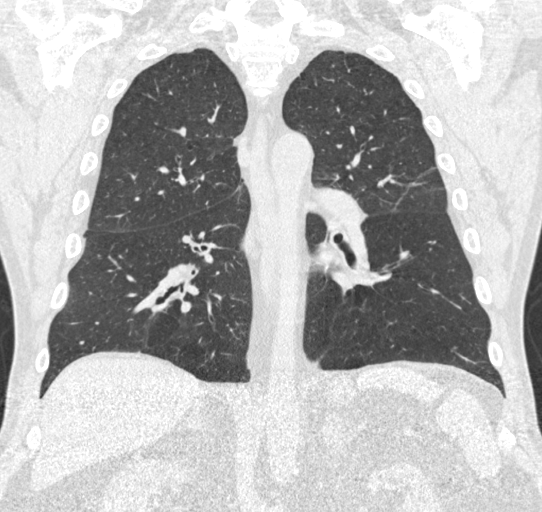

[15 of 40 positions shown; findings below may reference images not displayed]

FINDINGS: Cardiovascular: The heart size is normal. No substantial pericardial
effusion. Coronary artery calcification is evident. Atherosclerotic
calcification is noted in the wall of the thoracic aorta.

Mediastinum/Nodes: 2.8 cm anterior mediastinal lesion has
attenuation between fluid and blood pool. No mediastinal
lymphadenopathy. No evidence for gross hilar lymphadenopathy
although assessment is limited by the lack of intravenous contrast
on today's study. There is no axillary lymphadenopathy.

Lungs/Pleura: The central tracheobronchial airways are patent.
Multiple bilateral pulmonary nodules are identified some of which
are calcified and compatible with granulomata. Most suspicious
nodule is in the right middle lobe with volume derived equivalent
diameter of 10.2 mm. No pulmonary edema or pleural effusion.

Upper Abdomen: 1.9 cm left adrenal nodule with attenuation
suggesting benign adrenal adenoma.

Musculoskeletal: No worrisome lytic or sclerotic osseous
abnormality.
IMPRESSION: 1. 10.2 mm right middle lobe pulmonary nodule. Lung-RADS 4A,
suspicious. Follow up low-dose chest CT without contrast in 3 months
(please use the following order, "CT CHEST LCS NODULE FOLLOW-UP W/O
CM") is recommended. Alternatively, PET may be considered when there
is a solid component 8mm or larger.
2. 2.8 cm anterior mediastinal lesion is homogeneous with
well-defined margins but has attenuation too high to be a simple
cyst. This could a cyst complicated by proteinaceous debris or
hemorrhage but other etiology such as thymoma cannot be excluded.
Cardiothoracic surgery consultation may be warranted.
3. 1.9 cm left adrenal adenoma.
4. These results will be called to the ordering clinician or
representative by the Radiologist Assistant, and communication
documented in the PACS or zVision Dashboard.

## 2020-03-06 ENCOUNTER — Ambulatory Visit (INDEPENDENT_AMBULATORY_CARE_PROVIDER_SITE_OTHER): Payer: Medicare HMO | Admitting: Nurse Practitioner

## 2020-03-06 ENCOUNTER — Encounter (INDEPENDENT_AMBULATORY_CARE_PROVIDER_SITE_OTHER): Payer: Self-pay | Admitting: Nurse Practitioner

## 2020-03-06 ENCOUNTER — Other Ambulatory Visit: Payer: Self-pay

## 2020-03-06 ENCOUNTER — Ambulatory Visit (INDEPENDENT_AMBULATORY_CARE_PROVIDER_SITE_OTHER): Payer: Medicare HMO

## 2020-03-06 VITALS — BP 151/79 | HR 68 | Ht 69.0 in | Wt 221.0 lb

## 2020-03-06 DIAGNOSIS — I739 Peripheral vascular disease, unspecified: Secondary | ICD-10-CM

## 2020-03-06 DIAGNOSIS — E782 Mixed hyperlipidemia: Secondary | ICD-10-CM

## 2020-03-06 DIAGNOSIS — E119 Type 2 diabetes mellitus without complications: Secondary | ICD-10-CM

## 2020-03-06 DIAGNOSIS — I10 Essential (primary) hypertension: Secondary | ICD-10-CM | POA: Diagnosis not present

## 2020-03-06 DIAGNOSIS — Z720 Tobacco use: Secondary | ICD-10-CM

## 2020-03-06 NOTE — Progress Notes (Signed)
Subjective:    Patient ID: Angel Simon, male    DOB: May 14, 1951, 69 y.o.   MRN: [id number] Chief Complaint  Patient presents with  . Follow-up    U/S follow up    The patient returns to the office for followup and review of the noninvasive studies. There has been a significant deterioration in the lower extremity symptoms.  The patient notes interval shortening of their claudication distance and development of mild rest pain symptoms. No new ulcers or wounds have occurred since the last visit.  There have been no significant changes to the patient's overall health care.  The patient denies amaurosis fugax or recent TIA symptoms. There are no recent neurological changes noted. The patient denies history of DVT, PE or superficial thrombophlebitis. The patient denies recent episodes of angina or shortness of breath.   ABI's Rt=0.66 and Lt=0.91 (previous ABI's Rt=0.88 and Lt=0.94) Duplex US of the lower extremity arterial system shows dampened monophasic tibial artery waveforms with biphasic/triphasic left tibial artery waveforms.  Good toe waveforms in the left lower extremity with diminished waveforms on the right.  Additional views of the right lower extremity show the new SFA stent is occluded throughout.   Review of Systems  Cardiovascular:       Claudication  Skin: Positive for color change.  All other systems reviewed and are negative.      Objective:   Physical Exam Vitals reviewed.  HENT:     Head: Normocephalic.  Cardiovascular:     Rate and Rhythm: Normal rate and regular rhythm.     Pulses:          Dorsalis pedis pulses are 1+ on the right side and 1+ on the left side.       Posterior tibial pulses are 1+ on the right side and 0 on the left side.  Pulmonary:     Effort: Pulmonary effort is normal.  Skin:    Capillary Refill: Capillary refill takes 2 to 3 seconds.     Comments: Toes right lower extremity are cool with blue tinge  Neurological:     Mental  Status: He is alert.  Psychiatric:        Mood and Affect: Mood normal.        Behavior: Behavior normal.        Thought Content: Thought content normal.        Judgment: Judgment normal.     BP (!) 151/79   Pulse 68   Ht 5\' 9"  (1.753 m)   Wt 221 lb (100.2 kg)   BMI 32.64 kg/m   Past Medical History:  Diagnosis Date  . COPD (chronic obstructive pulmonary disease) ([place])   . Diabetes mellitus without complication ([place])   . Hyperlipidemia   . Hypertension     Social History   Socioeconomic History  . Marital status: Married    Spouse name: Not on file  . Number of children: Not on file  . Years of education: Not on file  . Highest education level: Not on file  Occupational History  . Not on file  Tobacco Use  . Smoking status: Current Every Day Smoker    Packs/day: 1.00    Years: 54.00    Pack years: 54.00    Types: Cigarettes  . Smokeless tobacco: Never Used  Vaping Use  . Vaping Use: Never used  Substance and Sexual Activity  . Alcohol use: No  . Drug use: No  . Sexual activity: Yes  Other  Topics Concern  . Not on file  Social History Narrative  . Not on file   Social Determinants of Health   Financial Resource Strain:   . Difficulty of Paying Living Expenses:   Food Insecurity:   . Worried About Charity fundraiser in the Last Year:   . Arboriculturist in the Last Year:   Transportation Needs:   . Film/video editor (Medical):   Marland Kitchen Lack of Transportation (Non-Medical):   Physical Activity:   . Days of Exercise per Week:   . Minutes of Exercise per Session:   Stress:   . Feeling of Stress :   Social Connections:   . Frequency of Communication with Friends and Family:   . Frequency of Social Gatherings with Friends and Family:   . Attends Religious Services:   . Active Member of Clubs or Organizations:   . Attends Archivist Meetings:   Marland Kitchen Marital Status:   Intimate Partner Violence:   . Fear of Current or Ex-Partner:   .  Emotionally Abused:   Marland Kitchen Physically Abused:   . Sexually Abused:     Past Surgical History:  Procedure Laterality Date  . LOWER EXTREMITY ANGIOGRAPHY Right 09/05/2018   Procedure: LOWER EXTREMITY ANGIOGRAPHY;  Surgeon: Algernon Huxley, MD;  Location: [facility];  Service: Cardiovascular;  Laterality: Right;  . MOUTH SURGERY    . NO PAST SURGERIES      Family History  Problem Relation Age of Onset  . Diabetes Mother   . Cancer Father   . Cancer Sister     Allergies  Allergen Reactions  . Aspirin Other (See Comments)    Fulness in chest and upper abd.       Assessment & Plan:   1. PAD (peripheral artery disease) ([place]) Today's noninvasive studies show that the patient does have evidence of occlusion of his SFA stent in the right lower extremity.  The patient is advised that angiogram would be in his best interest at this time.  However, due to cost and timing the patient wishes to delay proceeding with angiogram at this time.  The patient currently does not have any limb threatening symptoms at this time, however I discussed with patient different symptoms that would signal a need for urgent follow-up.  The symptoms include severe aching pain of the lower extremity present without activity, significant decrease in ambulation distance, pallor or coolness of the lower extremity, or new ulceration or prominent discoloration of the toes.  In the event that any of these happen the patient is advised to contact her office soon as possible or present to the local emergency room.  I also discussed with patient that there is not a way to determine when or if this could become an emergent situation.  Patient understands.  Otherwise, we will have the patient return to the office with noninvasive studies in 3 months.  2. Essential hypertension Continue antihypertensive medications as already ordered, these medications have been reviewed and there are no changes at this time.   3. Type 2  diabetes mellitus without complication, without long-term current use of insulin (HCC) Continue hypoglycemic medications as already ordered, these medications have been reviewed and there are no changes at this time.  Hgb A1C to be monitored as already arranged by primary service   4. Hyperlipidemia, mixed Continue statin as ordered and reviewed, no changes at this time   5. Tobacco abuse Smoking cessation was discussed, 3-10 minutes  spent on this topic specifically    Current Outpatient Medications on File Prior to Visit  Medication Sig Dispense Refill  . clopidogrel (PLAVIX) 75 MG tablet Take 1 tablet (75 mg total) by mouth daily. 30 tablet 11  . clopidogrel (PLAVIX) 75 MG tablet Take by mouth.    . hydrochlorothiazide (HYDRODIURIL) 25 MG tablet Take by mouth.    Marland Kitchen lisinopril (PRINIVIL,ZESTRIL) 40 MG tablet     . metoprolol tartrate (LOPRESSOR) 50 MG tablet Take 50 mg by mouth 2 (two) times daily.    . simvastatin (ZOCOR) 20 MG tablet Take by mouth.    . metFORMIN (GLUCOPHAGE-XR) 500 MG 24 hr tablet Take by mouth.     No current facility-administered medications on file prior to visit.    There are no Patient Instructions on file for this visit. No follow-ups on file.   Kris Hartmann, NP

## 2020-06-12 ENCOUNTER — Ambulatory Visit (INDEPENDENT_AMBULATORY_CARE_PROVIDER_SITE_OTHER): Payer: Medicare HMO | Admitting: Nurse Practitioner

## 2020-06-12 ENCOUNTER — Encounter (INDEPENDENT_AMBULATORY_CARE_PROVIDER_SITE_OTHER): Payer: Medicare HMO

## 2020-08-14 ENCOUNTER — Ambulatory Visit (INDEPENDENT_AMBULATORY_CARE_PROVIDER_SITE_OTHER): Payer: Medicare HMO

## 2020-08-14 ENCOUNTER — Encounter (INDEPENDENT_AMBULATORY_CARE_PROVIDER_SITE_OTHER): Payer: Self-pay | Admitting: Nurse Practitioner

## 2020-08-14 ENCOUNTER — Other Ambulatory Visit: Payer: Self-pay

## 2020-08-14 ENCOUNTER — Ambulatory Visit (INDEPENDENT_AMBULATORY_CARE_PROVIDER_SITE_OTHER): Payer: Medicare HMO | Admitting: Nurse Practitioner

## 2020-08-14 VITALS — BP 164/75 | HR 79 | Resp 16 | Wt 223.8 lb

## 2020-08-14 DIAGNOSIS — I10 Essential (primary) hypertension: Secondary | ICD-10-CM

## 2020-08-14 DIAGNOSIS — I739 Peripheral vascular disease, unspecified: Secondary | ICD-10-CM

## 2020-08-14 DIAGNOSIS — E782 Mixed hyperlipidemia: Secondary | ICD-10-CM

## 2020-08-14 DIAGNOSIS — E119 Type 2 diabetes mellitus without complications: Secondary | ICD-10-CM

## 2020-08-14 DIAGNOSIS — Z720 Tobacco use: Secondary | ICD-10-CM

## 2020-08-25 ENCOUNTER — Encounter (INDEPENDENT_AMBULATORY_CARE_PROVIDER_SITE_OTHER): Payer: Self-pay | Admitting: Nurse Practitioner

## 2020-08-25 NOTE — Progress Notes (Signed)
Subjective:    Patient ID: Angel Simon, male    DOB: Sep 14, 1950, 69 y.o.   MRN: [id number] Chief Complaint  Patient presents with  . Follow-up    3 month ultrasound follow up    The patient returns to the office for followup and review of the noninvasive studies. There have been no interval changes in lower extremity symptoms. No interval shortening of the patient's claudication distance or development of rest pain symptoms. No new ulcers or wounds have occurred since the last visit.  There have been no significant changes to the patient's overall health care.  The patient denies amaurosis fugax or recent TIA symptoms. There are no recent neurological changes noted. The patient denies history of DVT, PE or superficial thrombophlebitis. The patient denies recent episodes of angina or shortness of breath.   ABI Rt=0.62 and Lt=0.87  (previous ABI's Rt=0.66 and Lt=0.91) Duplex ultrasound of the bilateral tibial arteries has strong monophasic waveforms with good toe waveforms bilaterally.   Review of Systems  Cardiovascular: Negative for leg swelling.  Skin: Negative for wound.  All other systems reviewed and are negative.      Objective:   Physical Exam Vitals reviewed.  HENT:     Head: Normocephalic.  Cardiovascular:     Rate and Rhythm: Normal rate.     Pulses:          Dorsalis pedis pulses are 1+ on the right side and 1+ on the left side.       Posterior tibial pulses are 1+ on the right side and 0 on the left side.  Pulmonary:     Effort: Pulmonary effort is normal.  Musculoskeletal:        General: Normal range of motion.  Skin:    Capillary Refill: Capillary refill takes 2 to 3 seconds.  Neurological:     Mental Status: He is alert and oriented to person, place, and time.  Psychiatric:        Mood and Affect: Mood normal.        Behavior: Behavior normal.        Thought Content: Thought content normal.        Judgment: Judgment normal.     BP (!) 164/75  (BP Location: Right Arm)   Pulse 79   Resp 16   Wt 223 lb 12.8 oz (101.5 kg)   BMI 33.05 kg/m   Past Medical History:  Diagnosis Date  . COPD (chronic obstructive pulmonary disease) ([place])   . Diabetes mellitus without complication ([place])   . Hyperlipidemia   . Hypertension     Social History   Socioeconomic History  . Marital status: Married    Spouse name: Not on file  . Number of children: Not on file  . Years of education: Not on file  . Highest education level: Not on file  Occupational History  . Not on file  Tobacco Use  . Smoking status: Current Every Day Smoker    Packs/day: 1.00    Years: 54.00    Pack years: 54.00    Types: Cigarettes  . Smokeless tobacco: Never Used  Vaping Use  . Vaping Use: Never used  Substance and Sexual Activity  . Alcohol use: No  . Drug use: No  . Sexual activity: Yes  Other Topics Concern  . Not on file  Social History Narrative  . Not on file   Social Determinants of Health   Financial Resource Strain: Not on file  Food Insecurity:  Not on file  Transportation Needs: Not on file  Physical Activity: Not on file  Stress: Not on file  Social Connections: Not on file  Intimate Partner Violence: Not on file    Past Surgical History:  Procedure Laterality Date  . LOWER EXTREMITY ANGIOGRAPHY Right 09/05/2018   Procedure: LOWER EXTREMITY ANGIOGRAPHY;  Surgeon: Algernon Huxley, MD;  Location: [facility];  Service: Cardiovascular;  Laterality: Right;  . MOUTH SURGERY    . NO PAST SURGERIES      Family History  Problem Relation Age of Onset  . Diabetes Mother   . Cancer Father   . Cancer Sister     Allergies  Allergen Reactions  . Aspirin Other (See Comments)    Fulness in chest and upper abd.    No flowsheet data found.    CMP     Component Value Date/Time   BUN 18 09/02/2018 1040   CREATININE 0.84 09/02/2018 1040   GFRNONAA >60 09/02/2018 1040   GFRAA >60 09/02/2018 1040     VAS [place] ABI WITH/WO  TBI  Result Date: 08/16/2020 LOWER EXTREMITY DOPPLER STUDY Indications: Rest pain, and peripheral artery disease.  Vascular Interventions: 09/05/2018: PTA of the Right ATA and Right SFA.                         Stent Placement to the Right SFA. Comparison Study: 03/06/2020 Performing Technologist: Almira Coaster RVS  Examination Guidelines: A complete evaluation includes at minimum, Doppler waveform signals and systolic blood pressure reading at the level of bilateral brachial, anterior tibial, and posterior tibial arteries, when vessel segments are accessible. Bilateral testing is considered an integral part of a complete examination. Photoelectric Plethysmograph (PPG) waveforms and toe systolic pressure readings are included as required and additional duplex testing as needed. Limited examinations for reoccurring indications may be performed as noted.  ABI Findings: +---------+------------------+-----+----------+--------+ Right    Rt Pressure (mmHg)IndexWaveform  Comment  +---------+------------------+-----+----------+--------+ Brachial 200                                       +---------+------------------+-----+----------+--------+ ATA      113               0.56 monophasic         +---------+------------------+-----+----------+--------+ PTA      123               0.62 monophasic         +---------+------------------+-----+----------+--------+ Great Toe90                0.45 Abnormal           +---------+------------------+-----+----------+--------+ +---------+------------------+-----+----------+-------+ Left     Lt Pressure (mmHg)IndexWaveform  Comment +---------+------------------+-----+----------+-------+ Brachial 195                                      +---------+------------------+-----+----------+-------+ ATA      166               0.83 monophasic        +---------+------------------+-----+----------+-------+ PTA      174               0.87  monophasic        +---------+------------------+-----+----------+-------+ Nicolasa Ducking  0.65 Abnormal          +---------+------------------+-----+----------+-------+ +-------+-----------+-----------+------------+------------+ ABI/TBIToday's ABIToday's TBIPrevious ABIPrevious TBI +-------+-----------+-----------+------------+------------+ Right  .62        .45        .66         .57          +-------+-----------+-----------+------------+------------+ Left   .87        .68        .91         .68          +-------+-----------+-----------+------------+------------+ Bilateral ABIs appear decreased compared to prior study on 03/06/2020. Bilateral TBIs appear decreased compared to prior study on 03/06/2020.  Summary: Right: Resting right ankle-brachial index indicates moderate right lower extremity arterial disease. The right toe-brachial index is abnormal. Left: Resting left ankle-brachial index indicates mild left lower extremity arterial disease. The left toe-brachial index is abnormal.  *See table(s) above for measurements and observations.  Electronically signed by Festus Barren MD on 08/16/2020 at 10:49:58 AM.    Final        Assessment & Plan:   1. PAD (peripheral artery disease) (HCC)  Today's noninvasive studies show that the patient does have evidence of occlusion of his SFA stent in the right lower extremity.  The patient continues to have some claudication but but no lifestyle limiting claudication symptoms or rest pain.   The patient still does not wish to proceed with angiogram at this time.   The patient currently does not have any limb threatening symptoms at this time, however I discussed with patient different symptoms that would signal a need for urgent follow-up.  The symptoms include severe aching pain of the lower extremity present without activity, significant decrease in ambulation distance, pallor or coolness of the lower extremity, or new ulceration or  prominent discoloration of the toes.  In the event that any of these happen the patient is advised to contact her office soon as possible or present to the local emergency room.  I also discussed with patient that there is not a way to determine when or if this could become an emergent situation.  Patient understands.  Otherwise, we will have the patient return to the office with noninvasive studies in 6 months.  2. Essential hypertension Continue antihypertensive medications as already ordered, these medications have been reviewed and there are no changes at this time.   3. Type 2 diabetes mellitus without complication, without long-term current use of insulin (HCC) Continue hypoglycemic medications as already ordered, these medications have been reviewed and there are no changes at this time.  Hgb A1C to be monitored as already arranged by primary service   4. Hyperlipidemia, mixed Continue statin as ordered and reviewed, no changes at this time   5. Tobacco abuse Smoking cessation was discussed, 3-10 minutes spent on this topic specifically    Current Outpatient Medications on File Prior to Visit  Medication Sig Dispense Refill  . clopidogrel (PLAVIX) 75 MG tablet Take 1 tablet (75 mg total) by mouth daily. 30 tablet 11  . hydrochlorothiazide (HYDRODIURIL) 25 MG tablet Take by mouth.    Marland Kitchen lisinopril (PRINIVIL,ZESTRIL) 40 MG tablet     . metFORMIN (GLUCOPHAGE-XR) 500 MG 24 hr tablet Take 500 mg by mouth in the morning and at bedtime.    . metoprolol tartrate (LOPRESSOR) 50 MG tablet Take 50 mg by mouth 2 (two) times daily.    . simvastatin (ZOCOR) 20 MG tablet Take by mouth.  No current facility-administered medications on file prior to visit.    There are no Patient Instructions on file for this visit. No follow-ups on file.   Kris Hartmann, NP

## 2020-10-04 ENCOUNTER — Other Ambulatory Visit: Payer: Self-pay | Admitting: *Deleted

## 2020-10-04 DIAGNOSIS — Z87891 Personal history of nicotine dependence: Secondary | ICD-10-CM

## 2020-10-04 DIAGNOSIS — Z122 Encounter for screening for malignant neoplasm of respiratory organs: Secondary | ICD-10-CM

## 2020-10-04 DIAGNOSIS — F17200 Nicotine dependence, unspecified, uncomplicated: Secondary | ICD-10-CM

## 2020-10-04 NOTE — Progress Notes (Signed)
Contacted and scheduled for annual lung screening scan. Patient is a current smoker with a 56 pack year history.

## 2020-10-16 ENCOUNTER — Other Ambulatory Visit: Payer: Self-pay

## 2020-10-16 ENCOUNTER — Ambulatory Visit
Admission: RE | Admit: 2020-10-16 | Discharge: 2020-10-16 | Disposition: A | Discharge status: Home / Self Care | Payer: Medicare HMO | Source: Ambulatory Visit | Attending: Oncology | Admitting: Oncology

## 2020-10-16 DIAGNOSIS — Z87891 Personal history of nicotine dependence: Secondary | ICD-10-CM | POA: Diagnosis present

## 2020-10-16 DIAGNOSIS — Z122 Encounter for screening for malignant neoplasm of respiratory organs: Secondary | ICD-10-CM | POA: Insufficient documentation

## 2020-10-16 DIAGNOSIS — F17200 Nicotine dependence, unspecified, uncomplicated: Secondary | ICD-10-CM | POA: Diagnosis present

## 2020-10-18 ENCOUNTER — Encounter: Payer: Self-pay | Admitting: *Deleted

## 2021-02-12 ENCOUNTER — Encounter (INDEPENDENT_AMBULATORY_CARE_PROVIDER_SITE_OTHER): Payer: Medicare HMO

## 2021-02-12 ENCOUNTER — Ambulatory Visit (INDEPENDENT_AMBULATORY_CARE_PROVIDER_SITE_OTHER): Payer: Medicare HMO | Admitting: Nurse Practitioner

## 2021-02-13 ENCOUNTER — Other Ambulatory Visit (HOSPITAL_COMMUNITY): Payer: Self-pay | Admitting: Family Medicine

## 2021-02-13 ENCOUNTER — Other Ambulatory Visit: Payer: Self-pay | Admitting: Family Medicine

## 2021-02-13 DIAGNOSIS — Z136 Encounter for screening for cardiovascular disorders: Secondary | ICD-10-CM

## 2021-02-24 ENCOUNTER — Ambulatory Visit (INDEPENDENT_AMBULATORY_CARE_PROVIDER_SITE_OTHER): Payer: Medicare HMO | Admitting: Nurse Practitioner

## 2021-02-24 ENCOUNTER — Other Ambulatory Visit (INDEPENDENT_AMBULATORY_CARE_PROVIDER_SITE_OTHER): Payer: Self-pay | Admitting: Family Medicine

## 2021-02-24 ENCOUNTER — Other Ambulatory Visit (INDEPENDENT_AMBULATORY_CARE_PROVIDER_SITE_OTHER): Payer: Medicare HMO

## 2021-02-24 ENCOUNTER — Other Ambulatory Visit: Payer: Self-pay

## 2021-02-24 ENCOUNTER — Encounter (INDEPENDENT_AMBULATORY_CARE_PROVIDER_SITE_OTHER): Payer: Self-pay | Admitting: Nurse Practitioner

## 2021-02-24 VITALS — BP 106/68 | HR 76 | Ht 69.0 in | Wt 208.0 lb

## 2021-02-24 DIAGNOSIS — I10 Essential (primary) hypertension: Secondary | ICD-10-CM | POA: Diagnosis not present

## 2021-02-24 DIAGNOSIS — E782 Mixed hyperlipidemia: Secondary | ICD-10-CM

## 2021-02-24 DIAGNOSIS — I739 Peripheral vascular disease, unspecified: Secondary | ICD-10-CM

## 2021-02-24 DIAGNOSIS — E119 Type 2 diabetes mellitus without complications: Secondary | ICD-10-CM

## 2021-02-24 DIAGNOSIS — Z720 Tobacco use: Secondary | ICD-10-CM | POA: Diagnosis not present

## 2021-03-04 ENCOUNTER — Encounter (INDEPENDENT_AMBULATORY_CARE_PROVIDER_SITE_OTHER): Payer: Self-pay | Admitting: Nurse Practitioner

## 2021-03-04 NOTE — Progress Notes (Signed)
Subjective:    Patient ID: Angel Simon, male    DOB: [birth year], 70 y.o.   MRN: [id number] Chief Complaint  Patient presents with   Follow-up    6 mo [place]    The patient returns to the office for followup and review of the noninvasive studies. There have been no interval changes in lower extremity symptoms. No interval shortening of the patient's claudication distance or development of rest pain symptoms. No new ulcers or wounds have occurred since the last visit.  The patient does have a known SFA occlusion.  Previously placed SFA stents are occluded.  There have been no significant changes to the patient's overall health care.  The patient denies amaurosis fugax or recent TIA symptoms. There are no recent neurological changes noted. The patient denies history of DVT, PE or superficial thrombophlebitis. The patient denies recent episodes of angina or shortness of breath.   ABI Rt=0.46 and Lt=0.87  (previous ABI's Rt=0.62 and Lt=0.87) Duplex ultrasound of the right tibial arteries reveals monophasic waveforms.  The great toe waveforms are flat.  The TBI is 0.00.  The left lower extremity has biphasic/monophasic waveforms with dampened toe waveforms.   Review of Systems  Cardiovascular:        Claudication  All other systems reviewed and are negative.     Objective:   Physical Exam Vitals reviewed.  HENT:     Head: Normocephalic.  Cardiovascular:     Rate and Rhythm: Normal rate.     Pulses: Decreased pulses.  Pulmonary:     Effort: Pulmonary effort is normal.  Neurological:     Mental Status: He is alert and oriented to person, place, and time.  Psychiatric:        Mood and Affect: Mood normal.        Behavior: Behavior normal.        Thought Content: Thought content normal.        Judgment: Judgment normal.    BP 106/68   Pulse 76   Ht 5\' 9"  (1.753 m)   Wt 208 lb (94.3 kg)   BMI 30.72 kg/m   Past Medical History:  Diagnosis Date   COPD (chronic obstructive  pulmonary disease) (HCC)    Diabetes mellitus without complication ([place])    Hyperlipidemia    Hypertension     Social History   Socioeconomic History   Marital status: Married    Spouse name: Not on file   Number of children: Not on file   Years of education: Not on file   Highest education level: Not on file  Occupational History   Not on file  Tobacco Use   Smoking status: Every Day    Packs/day: 1.00    Years: 54.00    Pack years: 54.00    Types: Cigarettes   Smokeless tobacco: Never  Vaping Use   Vaping Use: Never used  Substance and Sexual Activity   Alcohol use: No   Drug use: No   Sexual activity: Yes  Other Topics Concern   Not on file  Social History Narrative   Not on file   Social Determinants of Health   Financial Resource Strain: Not on file  Food Insecurity: Not on file  Transportation Needs: Not on file  Physical Activity: Not on file  Stress: Not on file  Social Connections: Not on file  Intimate Partner Violence: Not on file    Past Surgical History:  Procedure Laterality Date   LOWER EXTREMITY ANGIOGRAPHY Right  09/05/2018   Procedure: LOWER EXTREMITY ANGIOGRAPHY;  Surgeon: Algernon Huxley, MD;  Location: [facility];  Service: Cardiovascular;  Laterality: Right;   MOUTH SURGERY     NO PAST SURGERIES      Family History  Problem Relation Age of Onset   Diabetes Mother    Cancer Father    Cancer Sister     Allergies  Allergen Reactions   Aspirin Other (See Comments)    Fulness in chest and upper abd.    No flowsheet data found.    CMP     Component Value Date/Time   BUN 18 09/02/2018 1040   CREATININE 0.84 09/02/2018 1040   GFRNONAA >60 09/02/2018 1040   GFRAA >60 09/02/2018 1040     VAS [place] ABI WITH/WO TBI  Result Date: 02/28/2021  LOWER EXTREMITY DOPPLER STUDY Patient Name:  Angel Simon  Date of Exam:   02/24/2021 Medical Rec #: [id number]       Accession #:    [id number] Date of Birth: [birth year]      Patient  Gender: M Patient Age:   69Y Exam Location:  [facility] Procedure:      VAS [place] ABI WITH/WO TBI Referring Phys: [id number] Jackqulyn Livings GOERES --------------------------------------------------------------------------------  Indications: Claudication.  Comparison Study: 08/14/2020 Performing Technologist: Almira Coaster RVS  Examination Guidelines: A complete evaluation includes at minimum, Doppler waveform signals and systolic blood pressure reading at the level of bilateral brachial, anterior tibial, and posterior tibial arteries, when vessel segments are accessible. Bilateral testing is considered an integral part of a complete examination. Photoelectric Plethysmograph (PPG) waveforms and toe systolic pressure readings are included as required and additional duplex testing as needed. Limited examinations for reoccurring indications may be performed as noted.  ABI Findings: +---------+------------------+-----+--------+--------+ Right    Rt Pressure (mmHg)IndexWaveformComment  +---------+------------------+-----+--------+--------+ Brachial 143                                     +---------+------------------+-----+--------+--------+ ATA      58                0.39                  +---------+------------------+-----+--------+--------+ PTA      68                0.46                  +---------+------------------+-----+--------+--------+ Great Toe0                 0.00                  +---------+------------------+-----+--------+--------+ +---------+------------------+-----+--------+-------+ Left     Lt Pressure (mmHg)IndexWaveformComment +---------+------------------+-----+--------+-------+ Brachial 149                                    +---------+------------------+-----+--------+-------+ ATA      97                0.65                 +---------+------------------+-----+--------+-------+ PTA      130               0.87                  +---------+------------------+-----+--------+-------+  Great Toe78                0.52                 +---------+------------------+-----+--------+-------+ +-------+-----------+-----------+------------+------------+ ABI/TBIToday's ABIToday's TBIPrevious ABIPrevious TBI +-------+-----------+-----------+------------+------------+ Right  .46        0.0        .62         .45          +-------+-----------+-----------+------------+------------+ Left   .87        .52        .87         .68          +-------+-----------+-----------+------------+------------+ Bilateral TBIs appear decreased compared to prior study on 08/14/2020. Right ABIs appear decreased compared to prior study on 08/14/2020. Left TBIs appear essentially unchanged compared to prior study on 08/14/2020.  Summary: Right: Resting right ankle-brachial index indicates severe right lower extremity arterial disease. The right toe-brachial index is abnormal. Left: Resting left ankle-brachial index indicates mild left lower extremity arterial disease. The left toe-brachial index is abnormal.  *See table(s) above for measurements and observations.  Electronically signed by Leotis Pain MD on 02/28/2021 at 9:30:34 AM.    Final        Assessment & Plan:   1. PAD (peripheral artery disease) (HCC) Recommend:  The patient has evidence of severe atherosclerotic changes of both lower extremities with rest pain that is associated with preulcerative changes and impending tissue loss of the foot.  This represents a limb threatening ischemia and places the patient at the risk for limb loss.  Patient should undergo angiography of the right lower extremities with the hope for intervention for limb salvage.  The risks and benefits as well as the alternative therapies was discussed in detail with the patient.  All questions were answered.  The patient wishes to discuss angiogram with wife, and indicated that he will contact her office when he is  ready to proceed.  The patient will follow up with me in the office after the procedure.       2. Tobacco abuse Smoking cessation was discussed, 3-10 minutes spent on this topic specifically   3. Type 2 diabetes mellitus without complication, without long-term current use of insulin (HCC) Continue hypoglycemic medications as already ordered, these medications have been reviewed and there are no changes at this time.  Hgb A1C to be monitored as already arranged by primary service   4. Essential hypertension Continue antihypertensive medications as already ordered, these medications have been reviewed and there are no changes at this time.   5. Hyperlipidemia, mixed Continue statin as ordered and reviewed, no changes at this time    Current Outpatient Medications on File Prior to Visit  Medication Sig Dispense Refill   clopidogrel (PLAVIX) 75 MG tablet Take 1 tablet (75 mg total) by mouth daily. 30 tablet 11   clopidogrel (PLAVIX) 75 MG tablet Take by mouth.     hydrochlorothiazide (HYDRODIURIL) 25 MG tablet Take by mouth.     lisinopril (PRINIVIL,ZESTRIL) 40 MG tablet      metoprolol tartrate (LOPRESSOR) 50 MG tablet Take 50 mg by mouth 2 (two) times daily.     simvastatin (ZOCOR) 20 MG tablet Take by mouth.     metFORMIN (GLUCOPHAGE-XR) 500 MG 24 hr tablet Take 500 mg by mouth in the morning and at bedtime.     No current facility-administered medications on file prior to visit.    There  are no Patient Instructions on file for this visit. No follow-ups on file.   Kris Hartmann, NP

## 2021-09-08 ENCOUNTER — Encounter: Payer: Self-pay | Admitting: *Deleted

## 2021-09-19 ENCOUNTER — Encounter: Payer: Self-pay | Admitting: Oncology

## 2021-09-19 ENCOUNTER — Inpatient Hospital Stay: Payer: Medicare Other | Attending: Oncology | Admitting: Oncology

## 2021-09-19 ENCOUNTER — Inpatient Hospital Stay: Payer: Medicare Other

## 2021-09-19 ENCOUNTER — Other Ambulatory Visit: Payer: Self-pay

## 2021-09-19 VITALS — BP 145/95 | HR 69 | Temp 96.6°F | Resp 16 | Ht 69.5 in | Wt 219.0 lb

## 2021-09-19 DIAGNOSIS — Z79899 Other long term (current) drug therapy: Secondary | ICD-10-CM | POA: Diagnosis not present

## 2021-09-19 DIAGNOSIS — Z7984 Long term (current) use of oral hypoglycemic drugs: Secondary | ICD-10-CM | POA: Diagnosis not present

## 2021-09-19 DIAGNOSIS — D720 Genetic anomalies of leukocytes: Secondary | ICD-10-CM | POA: Diagnosis present

## 2021-09-19 DIAGNOSIS — Z7901 Long term (current) use of anticoagulants: Secondary | ICD-10-CM | POA: Insufficient documentation

## 2021-09-19 DIAGNOSIS — I10 Essential (primary) hypertension: Secondary | ICD-10-CM | POA: Diagnosis not present

## 2021-09-19 DIAGNOSIS — J449 Chronic obstructive pulmonary disease, unspecified: Secondary | ICD-10-CM | POA: Insufficient documentation

## 2021-09-19 DIAGNOSIS — D72821 Monocytosis (symptomatic): Secondary | ICD-10-CM | POA: Insufficient documentation

## 2021-09-19 DIAGNOSIS — E785 Hyperlipidemia, unspecified: Secondary | ICD-10-CM | POA: Insufficient documentation

## 2021-09-19 DIAGNOSIS — E119 Type 2 diabetes mellitus without complications: Secondary | ICD-10-CM | POA: Insufficient documentation

## 2021-09-19 DIAGNOSIS — F17210 Nicotine dependence, cigarettes, uncomplicated: Secondary | ICD-10-CM | POA: Diagnosis not present

## 2021-09-19 DIAGNOSIS — E1151 Type 2 diabetes mellitus with diabetic peripheral angiopathy without gangrene: Secondary | ICD-10-CM | POA: Diagnosis not present

## 2021-09-19 DIAGNOSIS — D72829 Elevated white blood cell count, unspecified: Secondary | ICD-10-CM | POA: Insufficient documentation

## 2021-09-19 DIAGNOSIS — D729 Disorder of white blood cells, unspecified: Secondary | ICD-10-CM

## 2021-09-19 DIAGNOSIS — Z87891 Personal history of nicotine dependence: Secondary | ICD-10-CM

## 2021-09-19 LAB — CBC WITH DIFFERENTIAL/PLATELET
Abs Immature Granulocytes: 0.02 10*3/uL (ref 0.00–0.07)
Basophils Absolute: 0.1 10*3/uL (ref 0.0–0.1)
Basophils Relative: 1 %
Eosinophils Absolute: 0.1 10*3/uL (ref 0.0–0.5)
Eosinophils Relative: 1 %
HCT: 48.2 % (ref 39.0–52.0)
Hemoglobin: 16.2 g/dL (ref 13.0–17.0)
Immature Granulocytes: 0 %
Lymphocytes Relative: 22 %
Lymphs Abs: 2 10*3/uL (ref 0.7–4.0)
MCH: 29.1 pg (ref 26.0–34.0)
MCHC: 33.6 g/dL (ref 30.0–36.0)
MCV: 86.5 fL (ref 80.0–100.0)
Monocytes Absolute: 0.7 10*3/uL (ref 0.1–1.0)
Monocytes Relative: 8 %
Neutro Abs: 5.9 10*3/uL (ref 1.7–7.7)
Neutrophils Relative %: 68 %
Platelets: 228 10*3/uL (ref 150–400)
RBC: 5.57 MIL/uL (ref 4.22–5.81)
RDW: 13.5 % (ref 11.5–15.5)
WBC: 8.7 10*3/uL (ref 4.0–10.5)
nRBC: 0 % (ref 0.0–0.2)

## 2021-09-19 NOTE — Progress Notes (Signed)
Hematology/Oncology Consult note [facility] [phone] [phone]) [phone]  Patient Care Team: Zeb Comfort, MD as PCP - General (Family Medicine) Sindy Guadeloupe, MD as Consulting Physician (Hematology and Oncology)   Name of the patient: Angel Simon  [id number]  02-03-1951    Reason for referral-neutrophilia   Referring physician-Dr. Maurine Simmering  Date of visit: 09/19/21   History of presenting illness-patient is a 71 year old male with a past medical history significant for hypertension, hyperlipidemia, type 2 diabetes, COPD and peripheral arterial disease who has been referred to [place] for neutrophilia.His most recent CBC from 08/13/2021 showed a white count of 10.9, H&H of 15.1/46.4 with a platelet count of 312.  Differential showed mild absolute neutrophilia with an ANC of 8.1 and some monocytosis with an absolute monocyte count of 0.9.  In June 2022 he was noted to have a white count of 10.1 as well.  Patient smokes a pack of cigarettes per day which she has been doing so for the last 50 years.  Denies recent changes in his appetite and weight.  Denies any recurrent infections or hospitalizations  ECOG PS- 1  Pain scale- 0   Review of systems- Review of Systems  Constitutional:  Positive for malaise/fatigue. Negative for chills, fever and weight loss.  HENT:  Negative for congestion, ear discharge and nosebleeds.   Eyes:  Negative for blurred vision.  Respiratory:  Negative for cough, hemoptysis, sputum production, shortness of breath and wheezing.   Cardiovascular:  Negative for chest pain, palpitations, orthopnea and claudication.  Gastrointestinal:  Negative for abdominal pain, blood in stool, constipation, diarrhea, heartburn, melena, nausea and vomiting.  Genitourinary:  Negative for dysuria, flank pain, frequency, hematuria and urgency.  Musculoskeletal:  Negative for back pain, joint pain and myalgias.  Skin:  Negative for  rash.  Neurological:  Negative for dizziness, tingling, focal weakness, seizures, weakness and headaches.  Endo/Heme/Allergies:  Does not bruise/bleed easily.  Psychiatric/Behavioral:  Negative for depression and suicidal ideas. The patient does not have insomnia.    Allergies  Allergen Reactions   Aspirin Other (See Comments)    Fulness in chest and upper abd.    Patient Active Problem List   Diagnosis Date Noted   PAD (peripheral artery disease) ([place]) 06/10/2018   Type 2 diabetes mellitus without complication, without long-term current use of insulin ([place]) 09/07/2017   Essential hypertension 08/10/2017   Tobacco abuse 08/10/2017   History of hyperglycemia 08/10/2017   Chronic obstructive pulmonary disease ([place]) 08/03/2017   Hyperlipidemia, mixed 03/19/2015     Past Medical History:  Diagnosis Date   COPD (chronic obstructive pulmonary disease) (HCC)    Diabetes mellitus without complication (HCC)    Elevated WBC count    History of septic arthritis    Hyperlipidemia    Hypertension    PAD (peripheral artery disease) ([place])      Past Surgical History:  Procedure Laterality Date   ELBOW ARTHROCENTESIS Right    LOWER EXTREMITY ANGIOGRAPHY Right 09/05/2018   Procedure: LOWER EXTREMITY ANGIOGRAPHY;  Surgeon: Algernon Huxley, MD;  Location: [facility];  Service: Cardiovascular;  Laterality: Right;   MOUTH SURGERY      Social History   Socioeconomic History   Marital status: Married    Spouse name: Not on file   Number of children: Not on file   Years of education: Not on file   Highest education level: Not on file  Occupational History   Not on file  Tobacco Use   Smoking status: Every Day    Packs/day: 1.00    Years: 54.00    Pack years: 54.00    Types: Cigarettes   Smokeless tobacco: Never  Vaping Use   Vaping Use: Never used  Substance and Sexual Activity   Alcohol use: No   Drug use: No   Sexual activity: Yes  Other Topics Concern   Not on file   Social History Narrative   Not on file   Social Determinants of Health   Financial Resource Strain: Not on file  Food Insecurity: Not on file  Transportation Needs: Not on file  Physical Activity: Not on file  Stress: Not on file  Social Connections: Not on file  Intimate Partner Violence: Not on file     Family History  Problem Relation Age of Onset   Diabetes Mother    Lung cancer Father    Alzheimer's disease Brother      Current Outpatient Medications:    clopidogrel (PLAVIX) 75 MG tablet, Take 1 tablet (75 mg total) by mouth daily., Disp: 30 tablet, Rfl: 11   clopidogrel (PLAVIX) 75 MG tablet, Take by mouth., Disp: , Rfl:    hydrochlorothiazide (HYDRODIURIL) 25 MG tablet, Take by mouth., Disp: , Rfl:    lisinopril (PRINIVIL,ZESTRIL) 40 MG tablet, Take 40 mg by mouth daily., Disp: , Rfl:    metoprolol tartrate (LOPRESSOR) 50 MG tablet, Take 50 mg by mouth 2 (two) times daily., Disp: , Rfl:    simvastatin (ZOCOR) 20 MG tablet, Take by mouth., Disp: , Rfl:    metFORMIN (GLUCOPHAGE-XR) 500 MG 24 hr tablet, Take 500 mg by mouth in the morning and at bedtime., Disp: , Rfl:    Physical exam:  Vitals:   09/19/21 1404  BP: (!) 145/95  Pulse: 69  Resp: 16  Temp: (!) 96.6 F (35.9 C)  TempSrc: Tympanic  Weight: 219 lb (99.3 kg)  Height: 5' 9.5" (1.765 m)   Physical Exam Cardiovascular:     Rate and Rhythm: Normal rate and regular rhythm.     Heart sounds: Normal heart sounds.  Pulmonary:     Effort: Pulmonary effort is normal.     Breath sounds: Normal breath sounds.  Abdominal:     General: Bowel sounds are normal.     Palpations: Abdomen is soft.  Lymphadenopathy:     Comments: No palpable cervical, supraclavicular, axillary or inguinal adenopathy    Skin:    General: Skin is warm and dry.  Neurological:     Mental Status: He is alert and oriented to person, place, and time.       CMP Latest Ref Rng & Units 09/02/2018  BUN 8 - 23 mg/dL 18  Creatinine  0.61 - 1.24 mg/dL 0.84    Assessment and plan- Patient is a 70 y.o. male referred for leukocytosis/neutrophilia  Recent CBC shows mild neutrophilia along with monocytosis.  White count was elevated at 10.5.  Suspect this is reactive secondary to his smoking.  We will check CBC and flow cytometry and see him back in 2 weeks time to discuss the results of blood work.  Of note patient is a long-term smoker and did undergo a low-dose screening CT chest in February 2022 which did not show any concerning lung nodules and would be due for a repeat CT next month.  We will refer him to [facility] for this   Thank you for this kind referral and the opportunity to participate in the  care of this patient   Visit Diagnosis 1. Neutrophilia   2. Monocytosis   3. Personal history of tobacco use, presenting hazards to health     Dr. Randa Evens, MD, MPH [facility] [phone] 09/19/2021

## 2021-09-22 LAB — COMP PANEL: LEUKEMIA/LYMPHOMA

## 2021-10-03 ENCOUNTER — Encounter: Payer: Self-pay | Admitting: Oncology

## 2021-10-03 ENCOUNTER — Inpatient Hospital Stay: Payer: Medicare Other | Attending: Oncology | Admitting: Oncology

## 2021-10-03 DIAGNOSIS — D729 Disorder of white blood cells, unspecified: Secondary | ICD-10-CM

## 2021-10-03 NOTE — Progress Notes (Signed)
RN called and verified pt name and date of birth.  Pt reports he has been having some neck pain on right side that started 3 weeks ago.  RN instructed pt that MD would send link for mychart video visit closer to scheduled time of 245p.  Pt reports he had forgot about appointment but would be available at that time.

## 2021-11-13 ENCOUNTER — Other Ambulatory Visit: Payer: Self-pay

## 2021-11-26 ENCOUNTER — Ambulatory Visit
Admission: RE | Admit: 2021-11-26 | Discharge: 2021-11-26 | Disposition: A | Discharge status: Home / Self Care | Payer: Medicare Other | Source: Ambulatory Visit | Attending: Acute Care | Admitting: Acute Care

## 2021-11-26 DIAGNOSIS — Z87891 Personal history of nicotine dependence: Secondary | ICD-10-CM | POA: Insufficient documentation

## 2021-11-26 DIAGNOSIS — F17210 Nicotine dependence, cigarettes, uncomplicated: Secondary | ICD-10-CM | POA: Diagnosis present

## 2021-11-28 ENCOUNTER — Other Ambulatory Visit: Payer: Self-pay

## 2021-11-28 DIAGNOSIS — Z87891 Personal history of nicotine dependence: Secondary | ICD-10-CM

## 2021-11-28 DIAGNOSIS — F17210 Nicotine dependence, cigarettes, uncomplicated: Secondary | ICD-10-CM

## 2022-05-29 NOTE — Progress Notes (Signed)
Patient was not able to be contacted

## 2022-06-29 ENCOUNTER — Encounter (INDEPENDENT_AMBULATORY_CARE_PROVIDER_SITE_OTHER): Payer: Self-pay

## 2022-09-23 ENCOUNTER — Other Ambulatory Visit (INDEPENDENT_AMBULATORY_CARE_PROVIDER_SITE_OTHER): Payer: Self-pay | Admitting: Nurse Practitioner

## 2022-09-23 DIAGNOSIS — Z9889 Other specified postprocedural states: Secondary | ICD-10-CM

## 2022-09-28 ENCOUNTER — Ambulatory Visit (INDEPENDENT_AMBULATORY_CARE_PROVIDER_SITE_OTHER): Payer: Medicare HMO

## 2022-09-28 ENCOUNTER — Encounter (INDEPENDENT_AMBULATORY_CARE_PROVIDER_SITE_OTHER): Payer: Self-pay | Admitting: Nurse Practitioner

## 2022-09-28 ENCOUNTER — Ambulatory Visit (INDEPENDENT_AMBULATORY_CARE_PROVIDER_SITE_OTHER): Payer: Medicare HMO | Admitting: Nurse Practitioner

## 2022-09-28 VITALS — BP 156/74 | HR 82 | Ht 69.5 in | Wt 219.0 lb

## 2022-09-28 DIAGNOSIS — Z720 Tobacco use: Secondary | ICD-10-CM

## 2022-09-28 DIAGNOSIS — E782 Mixed hyperlipidemia: Secondary | ICD-10-CM

## 2022-09-28 DIAGNOSIS — I739 Peripheral vascular disease, unspecified: Secondary | ICD-10-CM

## 2022-09-28 DIAGNOSIS — Z9889 Other specified postprocedural states: Secondary | ICD-10-CM | POA: Diagnosis not present

## 2022-09-28 DIAGNOSIS — I70213 Atherosclerosis of native arteries of extremities with intermittent claudication, bilateral legs: Secondary | ICD-10-CM

## 2022-09-28 DIAGNOSIS — I10 Essential (primary) hypertension: Secondary | ICD-10-CM

## 2022-09-28 NOTE — Progress Notes (Signed)
Subjective:    Patient ID: Angel Simon, male    DOB: February 26, 1951, 72 y.o.   MRN: [id number] Chief Complaint  Patient presents with   Follow-up    ABI    Angel Simon is a 72 year old male who returns today for follow-up of noninvasive studies.  The patient has not been seen in approximately 18 months.  At last office visit was recommended that he undergo angiogram due to decreased perfusion studies.  However the patient notes that he became busy with life and this was unable to move forward with an angiogram at that time.  In that timeframe he notes that his claudication-like symptoms have worsened significantly.  He notes that he can only walk approximately 50 feet before having significant claudication symptoms.  He denies any open wounds or ulcerations.  He denies any rest pain like symptoms.  Today noninvasive studies show an ABI of 0.70 on the right and an ABI of 0.88 on the left.  There is a TBI 0.50 on the right and 0.94 on the left.  There is monophasic tibial artery waveforms on the right with dampened toe waveforms with biphasic waveforms on the left and relatively normal toe waveforms.    Review of Systems  Cardiovascular:        Claudication  All other systems reviewed and are negative.      Objective:   Physical Exam Vitals reviewed.  HENT:     Head: Normocephalic.  Cardiovascular:     Rate and Rhythm: Normal rate.     Pulses:          Dorsalis pedis pulses are detected w/ Doppler on the right side and detected w/ Doppler on the left side.       Posterior tibial pulses are detected w/ Doppler on the right side and detected w/ Doppler on the left side.  Pulmonary:     Effort: Pulmonary effort is normal.  Skin:    General: Skin is warm and dry.  Neurological:     Mental Status: He is alert and oriented to person, place, and time.  Psychiatric:        Mood and Affect: Mood normal.        Behavior: Behavior normal.        Thought Content: Thought content  normal.        Judgment: Judgment normal.     BP (!) 156/74   Pulse 82   Ht 5' 9.5" (1.765 m)   Wt 219 lb (99.3 kg)   BMI 31.88 kg/m   Past Medical History:  Diagnosis Date   COPD (chronic obstructive pulmonary disease) (HCC)    Diabetes mellitus without complication (HCC)    Elevated WBC count    History of septic arthritis    Hyperlipidemia    Hypertension    PAD (peripheral artery disease) ([place])     Social History   Socioeconomic History   Marital status: Married    Spouse name: Not on file   Number of children: Not on file   Years of education: Not on file   Highest education level: Not on file  Occupational History   Not on file  Tobacco Use   Smoking status: Every Day    Packs/day: 1.00    Years: 54.00    Total pack years: 54.00    Types: Cigarettes   Smokeless tobacco: Never  Vaping Use   Vaping Use: Never used  Substance and Sexual Activity   Alcohol use: No  Drug use: No   Sexual activity: Yes  Other Topics Concern   Not on file  Social History Narrative   Not on file   Social Determinants of Health   Financial Resource Strain: Not on file  Food Insecurity: Not on file  Transportation Needs: Not on file  Physical Activity: Not on file  Stress: Not on file  Social Connections: Not on file  Intimate Partner Violence: Not on file    Past Surgical History:  Procedure Laterality Date   ELBOW ARTHROCENTESIS Right    LOWER EXTREMITY ANGIOGRAPHY Right 09/05/2018   Procedure: LOWER EXTREMITY ANGIOGRAPHY;  Surgeon: Algernon Huxley, MD;  Location: [facility];  Service: Cardiovascular;  Laterality: Right;   MOUTH SURGERY      Family History  Problem Relation Age of Onset   Diabetes Mother    Lung cancer Father    Alzheimer's disease Brother     Allergies  Allergen Reactions   Aspirin Other (See Comments)    Fulness in chest and upper abd.       Latest Ref Rng & Units 09/19/2021    2:23 PM  CBC  WBC 4.0 - 10.5 K/uL 8.7    Hemoglobin 13.0 - 17.0 g/dL 16.2   Hematocrit 39.0 - 52.0 % 48.2   Platelets 150 - 400 K/uL 228       CMP     Component Value Date/Time   BUN 18 09/02/2018 1040   CREATININE 0.84 09/02/2018 1040   GFRNONAA >60 09/02/2018 1040   GFRAA >60 09/02/2018 1040     No results found.     Assessment & Plan:   1. Atherosclerosis of native artery of both lower extremities with intermittent claudication (HCC) Recommend:  The patient has experienced increased claudication symptoms and is now describing lifestyle limiting claudication and appears to be having mild rest pain symptroms.  Given the severity of the patient's severe lateral lower extremity symptoms the patient should undergo angiography with the hope for intervention.  Risk and benefits were reviewed the patient.  Indications for the procedure were reviewed.  All questions were answered, the patient agrees to proceed with right and left lower extremity angiography and possible intervention.   The patient should continue walking and begin a more formal exercise program.  The patient should continue antiplatelet therapy and aggressive treatment of the lipid abnormalities  The patient will follow up with me after the angiogram.   2. Essential hypertension Continue antihypertensive medications as already ordered, these medications have been reviewed and there are no changes at this time.  3. Hyperlipidemia, mixed Continue statin as ordered and reviewed, no changes at this time  4. Tobacco abuse The patient continues to smoke however he notes that he has been cutting down and attempting to quit.  Continued attempts for smoking cessation encouraged.   Current Outpatient Medications on File Prior to Visit  Medication Sig Dispense Refill   clopidogrel (PLAVIX) 75 MG tablet Take 1 tablet (75 mg total) by mouth daily. 30 tablet 11   hydrochlorothiazide (HYDRODIURIL) 25 MG tablet Take by mouth.     lisinopril (PRINIVIL,ZESTRIL) 40  MG tablet Take 40 mg by mouth daily.     metFORMIN (GLUCOPHAGE-XR) 500 MG 24 hr tablet Take 500 mg by mouth in the morning and at bedtime.     metoprolol tartrate (LOPRESSOR) 50 MG tablet Take 50 mg by mouth 2 (two) times daily.     simvastatin (ZOCOR) 20 MG tablet Take by mouth.  No current facility-administered medications on file prior to visit.    There are no Patient Instructions on file for this visit. No follow-ups on file.   Kris Hartmann, NP

## 2022-10-01 ENCOUNTER — Telehealth (INDEPENDENT_AMBULATORY_CARE_PROVIDER_SITE_OTHER): Payer: Self-pay

## 2022-10-01 NOTE — Telephone Encounter (Signed)
Spoke with the patient to get him scheduled for the dates of 10/12/22 and 10/19/22 for his RLE and LLE angio with Dr. Lucky Cowboy. Patient stated he had an appt on 10/12/22 and was feeling pretty good now and wanted to wait until June or July to be scheduled for his procedures.

## 2022-10-09 LAB — VAS US ABI WITH/WO TBI
Left ABI: 0.88
Right ABI: 0.7

## 2022-11-27 ENCOUNTER — Ambulatory Visit: Payer: Medicare HMO | Attending: Family Medicine

## 2023-01-13 ENCOUNTER — Telehealth: Payer: Self-pay | Admitting: Acute Care

## 2023-01-13 NOTE — Telephone Encounter (Signed)
Called and left VM for pt to schedule for LDCT. Pt was previously scheduled for LDCT on 11/27/22 and was a no-show.

## 2023-01-14 ENCOUNTER — Other Ambulatory Visit: Payer: Self-pay | Admitting: *Deleted

## 2023-01-14 DIAGNOSIS — Z87891 Personal history of nicotine dependence: Secondary | ICD-10-CM

## 2023-01-14 DIAGNOSIS — F17210 Nicotine dependence, cigarettes, uncomplicated: Secondary | ICD-10-CM

## 2023-01-14 DIAGNOSIS — Z122 Encounter for screening for malignant neoplasm of respiratory organs: Secondary | ICD-10-CM

## 2023-01-18 NOTE — Telephone Encounter (Signed)
Pt has been scheduled for Ct on 01/27/23. Will close encounter.

## 2023-01-27 ENCOUNTER — Ambulatory Visit
Admission: RE | Admit: 2023-01-27 | Discharge: 2023-01-27 | Disposition: A | Discharge status: Home / Self Care | Payer: Medicare HMO | Source: Ambulatory Visit | Attending: Acute Care | Admitting: Acute Care

## 2023-01-27 DIAGNOSIS — Z87891 Personal history of nicotine dependence: Secondary | ICD-10-CM | POA: Diagnosis present

## 2023-01-27 DIAGNOSIS — F17210 Nicotine dependence, cigarettes, uncomplicated: Secondary | ICD-10-CM | POA: Insufficient documentation

## 2023-01-27 DIAGNOSIS — Z122 Encounter for screening for malignant neoplasm of respiratory organs: Secondary | ICD-10-CM | POA: Insufficient documentation

## 2023-02-03 ENCOUNTER — Other Ambulatory Visit: Payer: Self-pay | Admitting: Family Medicine

## 2023-02-03 DIAGNOSIS — R911 Solitary pulmonary nodule: Secondary | ICD-10-CM

## 2023-02-05 ENCOUNTER — Telehealth: Payer: Self-pay

## 2023-02-05 ENCOUNTER — Telehealth: Payer: Self-pay | Admitting: Acute Care

## 2023-02-05 NOTE — Telephone Encounter (Signed)
Noted.  Please see duplicate phone note dated 02/05/2023

## 2023-02-05 NOTE — Telephone Encounter (Signed)
I have attempted to call the patient with the results of his low-dose screening CT.  There was no answer.  I attempted contact on both his home phone and his cell phone.  Home phone just rang continuously, there was no option of leaving a message.  Cell phone voicemail was full. I have discussed this patient with Dr. Campbell Lerner.  While his scan was read as a lung RADS three 81-month follow-up there has been slow growth of a nonsolid nodule from 12.1 mm to 16.6 mm to 23.2 mm over 2-year period of time.  Dr. Campbell Lerner will see the patient in his office in the nodule slot to discuss best options for plan of care.  The [place] office will call and make the patient an appointment to be seen. Denise please follow to ensure we know when to return patient to the lung screening program. Dr. Orlean Bradford is usually really good about letting us know follow-up plans. Please fax results to PCP, let them know plan is for patient to see a pulmonologist in the [place] office as follow-up

## 2023-02-05 NOTE — Telephone Encounter (Signed)
Dr. Aundria Rud, this patient's scan was read as a LR 3, so 6 month follow up recommended. He has a nodule that has been part solid and growing each year. 2022>> 12.1 mm, 2023>> 16.6 mm and this year 23.2 mm. Doubled in size in 2 years. There is now a more solid component that may be atelectasis but could be more dense nodule also. The steady growth is concerning. It is big enough to PET. Do you want to see him, do a PET , or just do a 6 month follow up scan? This one makes me a little nervous. Thanks

## 2023-02-05 NOTE — Telephone Encounter (Signed)
Faxed results and note to PCP that plan is for [place] pulm follow up OV to discuss further recommendations, and that we have attempted to reach the patient for scheduling and update.

## 2023-02-05 NOTE — Telephone Encounter (Signed)
We'll get him in for a clinic visit.

## 2023-02-05 NOTE — Telephone Encounter (Signed)
Received epic secure chat from Dr. Wilhemina Bonito needs appt in nodule clinic in the next few weeks to discuss possible bronch.  ATC patient--unable to leave vm due to mailbox being full.  Will call back.

## 2023-02-05 NOTE — Telephone Encounter (Signed)
Noted.  Please see duplicate phone note dated as 02/05/2023

## 2023-02-09 NOTE — Telephone Encounter (Signed)
ATC the patient. When I called his home and mobile numbers I received a message saying my phone call could not be completed as dialed.   I will try one more time, due to the nature of the call.

## 2023-02-11 NOTE — Telephone Encounter (Signed)
ATC the patient. LVM for the patient to return my call. I have also place a letter in the mail asking the patient to contact our office.  I will close this encounter per office protocol.

## 2023-02-23 NOTE — Telephone Encounter (Signed)
Called and left another VM for pt to call to schedule appt with Dr Aundria Rud in the [place] office.

## 2023-03-19 ENCOUNTER — Other Ambulatory Visit: Payer: Self-pay | Admitting: *Deleted

## 2023-03-19 DIAGNOSIS — R911 Solitary pulmonary nodule: Secondary | ICD-10-CM

## 2023-03-19 DIAGNOSIS — Z87891 Personal history of nicotine dependence: Secondary | ICD-10-CM

## 2023-08-02 ENCOUNTER — Ambulatory Visit: Admission: RE | Admit: 2023-08-02 | Payer: Medicare HMO | Source: Ambulatory Visit

## 2023-12-14 ENCOUNTER — Ambulatory Visit: Admission: RE | Admit: 2023-12-14 | Source: Ambulatory Visit

## 2023-12-22 ENCOUNTER — Ambulatory Visit
Admission: RE | Admit: 2023-12-22 | Discharge: 2023-12-22 | Disposition: A | Discharge status: Home / Self Care | Source: Ambulatory Visit | Attending: Acute Care | Admitting: Acute Care

## 2023-12-22 DIAGNOSIS — Z87891 Personal history of nicotine dependence: Secondary | ICD-10-CM | POA: Diagnosis present

## 2023-12-22 DIAGNOSIS — R911 Solitary pulmonary nodule: Secondary | ICD-10-CM | POA: Insufficient documentation

## 2024-01-18 ENCOUNTER — Telehealth: Payer: Self-pay

## 2024-01-18 DIAGNOSIS — F17210 Nicotine dependence, cigarettes, uncomplicated: Secondary | ICD-10-CM

## 2024-01-18 DIAGNOSIS — Z87891 Personal history of nicotine dependence: Secondary | ICD-10-CM

## 2024-01-18 DIAGNOSIS — Z122 Encounter for screening for malignant neoplasm of respiratory organs: Secondary | ICD-10-CM

## 2024-01-18 NOTE — Telephone Encounter (Signed)
 Call Report  IMPRESSION: 1. Lung-RADS 2, benign appearance or behavior. Continue annual screening with low-dose chest CT without contrast in 12 months. 2. 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 Recommend further evaluation with adrenal protocol CT or MRI. 3. Aortic Atherosclerosis (ICD10-I70.0) and Emphysema (ICD10-J43.9).

## 2024-01-20 ENCOUNTER — Encounter: Payer: Self-pay | Admitting: Emergency Medicine

## 2024-01-20 NOTE — Telephone Encounter (Signed)
 I attempted to reach the pt, unable to leave VM. Patient was seen by his PCP on 5/21 and discussed the LDCT results including the adrenal mass. An abd MRI was ordered. I have sent the pt a letter via MyChart as the concerns for the adrenal mass have been discussed and further action has been taken for evaluation. Annual LDCT has been ordered.

## 2024-01-20 NOTE — Addendum Note (Signed)
 Addended by: Veryl Gottron on: 01/20/2024 09:34 AM   Modules accepted: Orders

## 2024-01-20 NOTE — Telephone Encounter (Signed)
 Recent CT results have been reviewed by Margit Shelling, NP. She called the patient 01/19/2024 and was unable to LVM. She has LVM on wife's cell phone and sent a patient message for the patient to call the office and review results. Please review results below with patient. Patient will complete his annual Lung CT again next year. He will need to follow up with PCP and discuss right adrenal gland nodule now measuring 1.6 cm, MRI or CT recommend. Please call PCP and request their office order follow up imaging if warranted. Place annual Lung CT order.

## 2024-02-18 ENCOUNTER — Other Ambulatory Visit: Payer: Self-pay | Admitting: Family Medicine

## 2024-02-18 DIAGNOSIS — E278 Other specified disorders of adrenal gland: Secondary | ICD-10-CM

## 2024-05-21 ENCOUNTER — Other Ambulatory Visit: Payer: Self-pay | Admitting: Medical Genetics

## 2024-05-26 ENCOUNTER — Other Ambulatory Visit

## 2024-05-31 ENCOUNTER — Other Ambulatory Visit: Payer: Self-pay | Admitting: Family Medicine

## 2024-05-31 DIAGNOSIS — R140 Abdominal distension (gaseous): Secondary | ICD-10-CM

## 2024-05-31 DIAGNOSIS — Z87898 Personal history of other specified conditions: Secondary | ICD-10-CM

## 2024-06-21 ENCOUNTER — Ambulatory Visit
Admission: RE | Admit: 2024-06-21 | Discharge: 2024-06-21 | Disposition: A | Discharge status: Home / Self Care | Source: Ambulatory Visit | Attending: Family Medicine | Admitting: Family Medicine

## 2024-06-21 DIAGNOSIS — R140 Abdominal distension (gaseous): Secondary | ICD-10-CM | POA: Insufficient documentation

## 2024-06-21 DIAGNOSIS — Z87898 Personal history of other specified conditions: Secondary | ICD-10-CM | POA: Insufficient documentation

## 2024-06-28 ENCOUNTER — Other Ambulatory Visit
